# Patient Record
Sex: Male | Born: 1970 | Race: White | Hispanic: No | Marital: Married | Smoking: Current every day smoker
Health system: Southern US, Community
[De-identification: ages and names within clinical notes are randomized; demographics above are authoritative.]

## PROBLEM LIST (undated history)

## (undated) DIAGNOSIS — E785 Hyperlipidemia, unspecified: Secondary | ICD-10-CM

## (undated) DIAGNOSIS — J3089 Other allergic rhinitis: Secondary | ICD-10-CM

## (undated) DIAGNOSIS — F32A Depression, unspecified: Secondary | ICD-10-CM

## (undated) DIAGNOSIS — F329 Major depressive disorder, single episode, unspecified: Secondary | ICD-10-CM

## (undated) HISTORY — DX: Major depressive disorder, single episode, unspecified: F32.9

## (undated) HISTORY — DX: Depression, unspecified: F32.A

## (undated) HISTORY — PX: LEG SURGERY: SHX1003

---

## 1999-02-04 ENCOUNTER — Encounter: Admission: RE | Admit: 1999-02-04 | Discharge: 1999-02-11 | Payer: Self-pay

## 2004-05-09 ENCOUNTER — Emergency Department (HOSPITAL_COMMUNITY): Admission: EM | Admit: 2004-05-09 | Discharge: 2004-05-09 | Payer: Self-pay | Admitting: Family Medicine

## 2005-10-10 ENCOUNTER — Emergency Department (HOSPITAL_COMMUNITY): Admission: EM | Admit: 2005-10-10 | Discharge: 2005-10-11 | Payer: Self-pay | Admitting: Emergency Medicine

## 2011-11-04 ENCOUNTER — Ambulatory Visit (INDEPENDENT_AMBULATORY_CARE_PROVIDER_SITE_OTHER): Payer: No Typology Code available for payment source | Admitting: Family Medicine

## 2011-11-04 VITALS — BP 135/75 | HR 84 | Temp 98.5°F | Resp 17 | Ht 73.0 in | Wt 217.0 lb

## 2011-11-04 DIAGNOSIS — A084 Viral intestinal infection, unspecified: Secondary | ICD-10-CM

## 2011-11-04 DIAGNOSIS — A088 Other specified intestinal infections: Secondary | ICD-10-CM

## 2011-11-04 DIAGNOSIS — K649 Unspecified hemorrhoids: Secondary | ICD-10-CM

## 2011-11-04 NOTE — Progress Notes (Signed)
  [facility]:  Office Visit  Chief Complaint:  Chief Complaint  Patient presents with  . Diarrhea    3 days   . Hemorrhoids    7-10 years     HPI: Edward Doyle is a 41 y.o. male who complains of 4 day history of non bloody diarrhea after eating old lettuce. No fevers, chills. Tried Immodium got better but ate fettuchini, burger and milk and cereal and has gotten worse, 1 episode today. When started had 7 episodes. Denies recent camping, travels, new meds ie abx use.   No past medical history on file. No past surgical history on file. History   Social History  . Marital Status: Single    Spouse Name: N/A    Number of Children: N/A  . Years of Education: N/A   Social History Main Topics  . Smoking status: Current Everyday Smoker -- 0.5 packs/day for 20 years    Types: Cigarettes  . Smokeless tobacco: None  . Alcohol Use: None  . Drug Use: None  . Sexually Active: None   Other Topics Concern  . None   Social History Narrative  . None   No family history on file. No Known Allergies Prior to Admission medications   Not on File     ROS: The patient denies fevers, chills, night sweats, unintentional weight loss, chest pain, palpitations, wheezing, dyspnea on exertion, nausea, vomiting, abdominal pain, dysuria, hematuria, melena, numbness, weakness, or tingling. + diarrhea  All other systems have been reviewed and were otherwise negative with the exception of those mentioned in the HPI and as above.    PHYSICAL EXAM: Filed Vitals:   11/04/11 1512  BP: 135/75  Pulse: 84  Temp: 98.5 F (36.9 C)  Resp: 17   Filed Vitals:   11/04/11 1512  Height: 6\' 1"  (1.854 m)  Weight: 217 lb (98.431 kg)   Body mass index is 28.63 kg/(m^2).  General: Alert, no acute distress HEENT:  Normocephalic, atraumatic, oropharynx patent.  Cardiovascular:  Regular rate and rhythm, no rubs murmurs or gallops.  No Carotid bruits, radial pulse intact. No pedal edema.    Respiratory: Clear to auscultation bilaterally.  No wheezes, rales, or rhonchi.  No cyanosis, no use of accessory musculature GI: No organomegaly, abdomen is soft and non-tender, positive bowel sounds.  No masses. Skin: No rashes. Neurologic: Facial musculature symmetric. Psychiatric: Patient is appropriate throughout our interaction. Lymphatic: No cervical lymphadenopathy Musculoskeletal: Gait intact.   LABS: No results found for this or any previous visit.   EKG/XRAY:   Primary read interpreted by [facility] Conley [facility] at [facility].   ASSESSMENT/PLAN: Encounter Diagnoses  Name Primary?  . Viral gastroenteritis Yes  . Hemorrhoids    BRAT diet. Advance as tolerated. Increase fluid. If no improvement then will consider abx. Patient did not want hemorrhoids to be evaluated, d/w patient hemorrhoid care/management.    Hamilton Capri PHUONG, DO 11/04/2011 3:39 PM

## 2012-04-30 ENCOUNTER — Ambulatory Visit (INDEPENDENT_AMBULATORY_CARE_PROVIDER_SITE_OTHER): Payer: No Typology Code available for payment source | Admitting: Radiology

## 2012-04-30 DIAGNOSIS — Z23 Encounter for immunization: Secondary | ICD-10-CM

## 2012-10-16 ENCOUNTER — Ambulatory Visit (INDEPENDENT_AMBULATORY_CARE_PROVIDER_SITE_OTHER): Payer: No Typology Code available for payment source | Admitting: Family Medicine

## 2012-10-16 VITALS — BP 127/81 | HR 81 | Temp 98.7°F | Resp 16 | Ht 73.25 in | Wt 219.0 lb

## 2012-10-16 DIAGNOSIS — F419 Anxiety disorder, unspecified: Secondary | ICD-10-CM

## 2012-10-16 DIAGNOSIS — F17200 Nicotine dependence, unspecified, uncomplicated: Secondary | ICD-10-CM

## 2012-10-16 DIAGNOSIS — F411 Generalized anxiety disorder: Secondary | ICD-10-CM

## 2012-10-16 DIAGNOSIS — F17210 Nicotine dependence, cigarettes, uncomplicated: Secondary | ICD-10-CM

## 2012-10-16 MED ORDER — SERTRALINE HCL 50 MG PO TABS: 50.0000 mg | ORAL_TABLET | Freq: Every day | ORAL | Status: DC

## 2012-10-16 MED ORDER — VARENICLINE TARTRATE 0.5 MG PO TABS: 0.5000 mg | ORAL_TABLET | Freq: Two times a day (BID) | ORAL | Status: DC

## 2012-10-16 NOTE — Progress Notes (Signed)
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 He has 2 children, a boy 35 and a girl 61, and is having marital discord. The problems began many years ago but over the last 2 months he's been aware of his wife's relationships outside of marriage and her use of and abuse of prescription drugs. She will go to house to her apartment 5 days ago. Patient's been under treatment distress is having trouble sleeping more than 45 minutes.  Patient had problems he was in the service and believes he took Lexapro. That helped a great deal. In the past she's also tried Wellbutrin and Zyban for smoking cessation but they caused severe nightmares.  Patient also wanted to quit smoking. He states his 67th birthday is the day reckoning.  Objective:  LOC the patient for approximately 30 minutes as he related the story and difficulties he's having.  Assessment: Depression/anxiety, desire to quit smoking  Plan: Cigarette smoker - Plan: varenicline (CHANTIX) 0.5 MG tablet, DISCONTINUED: varenicline (CHANTIX) 0.5 MG tablet  Anxiety - Plan: sertraline (ZOLOFT) 50 MG tablet, DISCONTINUED: sertraline (ZOLOFT) 50 MG tablet  Signed, Elvina Sidle, MD

## 2012-10-29 ENCOUNTER — Telehealth: Payer: Self-pay

## 2012-10-29 NOTE — Telephone Encounter (Signed)
Pt is calling to let dr Milus Glazier know that the zoloft is not working   [employer] number [phone]

## 2012-10-30 MED ORDER — SERTRALINE HCL 100 MG PO TABS: 100.0000 mg | ORAL_TABLET | Freq: Every day | ORAL | Status: DC

## 2012-10-30 NOTE — Telephone Encounter (Signed)
I would suggest increasing the zoloft to 100 mg daily (2).  If not improving in a week, we should recheck.

## 2012-10-30 NOTE — Telephone Encounter (Signed)
Patient advised. 100 mg sent in, so when he refills it will be correct.

## 2012-10-30 NOTE — Addendum Note (Signed)
Addended byCaffie Damme on: 10/30/2012 09:29 AM   Modules accepted: Orders

## 2012-11-18 ENCOUNTER — Ambulatory Visit (INDEPENDENT_AMBULATORY_CARE_PROVIDER_SITE_OTHER): Payer: No Typology Code available for payment source | Admitting: Family Medicine

## 2012-11-18 VITALS — BP 100/78 | HR 85 | Temp 98.0°F | Resp 16 | Ht 73.0 in | Wt 219.0 lb

## 2012-11-18 DIAGNOSIS — J209 Acute bronchitis, unspecified: Secondary | ICD-10-CM

## 2012-11-18 DIAGNOSIS — J329 Chronic sinusitis, unspecified: Secondary | ICD-10-CM

## 2012-11-18 MED ORDER — HYDROCODONE-HOMATROPINE 5-1.5 MG/5ML PO SYRP: 5.0000 mL | ORAL_SOLUTION | Freq: Three times a day (TID) | ORAL | Status: DC | PRN

## 2012-11-18 MED ORDER — LEVOFLOXACIN 500 MG PO TABS: 500.0000 mg | ORAL_TABLET | Freq: Every day | ORAL | Status: DC

## 2012-11-18 NOTE — Progress Notes (Signed)
Edward Doyle MRN: [id number], DOB: 1971-02-24, 42 y.o. Date of Encounter: 11/18/2012, 8:46 AM  Primary Physician: No primary provider on file.  Chief Complaint:  Chief Complaint  Patient presents with  . Sinusitis    x 2 days    HPI: 42 y.o. year old male presents with a 2 day history of nasal congestion, post nasal drip, sore throat, and cough. Mild sinus pressure. Afebrile. No chills. Nasal congestion thick and green/yellow. Cough is productive of green/yellow sputum and not associated with time of day. Ears feel full, leading to sensation of muffled hearing. Has tried OTC cold preps without success. No GI complaints.   No sick contacts, recent antibiotics, or recent travels.   No leg trauma, sedentary periods, h/o cancer, or tobacco use.  History reviewed. No pertinent past medical history.   Home Meds: Prior to Admission medications   Medication Sig Start Date End Date Taking? Authorizing Provider  sertraline (ZOLOFT) 100 MG tablet Take 1 tablet (100 mg total) by mouth daily. 10/30/12  Yes Elvina Sidle, MD  varenicline (CHANTIX) 0.5 MG tablet Take 1 tablet (0.5 mg total) by mouth 2 (two) times daily. 10/16/12  Yes Elvina Sidle, MD    Allergies: No Known Allergies  History   Social History  . Marital Status: Single    Spouse Name: N/A    Number of Children: N/A  . Years of Education: N/A   Occupational History  . Not on file.   Social History Main Topics  . Smoking status: Current Every Day Smoker -- 0.50 packs/day for 20 years    Types: Cigarettes  . Smokeless tobacco: Not on file  . Alcohol Use: Not on file  . Drug Use: Not on file  . Sexually Active: Not on file   Other Topics Concern  . Not on file   Social History Narrative  . No narrative on file     Review of Systems: Constitutional: negative for chills, fever, night sweats or weight changes Cardiovascular: negative for chest pain or palpitations Respiratory: negative for hemoptysis, wheezing, or  shortness of breath Abdominal: negative for abdominal pain, nausea, vomiting or diarrhea Dermatological: negative for rash Neurologic: negative for headache   Physical Exam: Blood pressure 100/78, pulse 85, temperature 98 F (36.7 C), temperature source Oral, resp. rate 16, height 6\' 1"  (1.854 m), weight 219 lb (99.338 kg), SpO2 95.00%., Body mass index is 28.9 kg/(m^2). General: Well developed, well nourished, in no acute distress. Head: Normocephalic, atraumatic, eyes without discharge, sclera non-icteric, nares are congested. Bilateral auditory canals clear, TM's are without perforation, pearly grey with reflective cone of light bilaterally. No sinus TTP. Oral cavity moist, dentition normal. Posterior pharynx with post nasal drip and mild erythema. No peritonsillar abscess or tonsillar exudate. Neck: Supple. No thyromegaly. Full ROM. No lymphadenopathy. Lungs: Coarse breath sounds bilaterally without wheezes, rales, or rhonchi. Breathing is unlabored.  Heart: RRR with S1 S2. No murmurs, rubs, or gallops appreciated. Msk:  Strength and tone normal for age. Extremities: No clubbing or cyanosis. No edema. Neuro: Alert and oriented X 3. Moves all extremities spontaneously. CNII-XII grossly in tact. Psych:  Responds to questions appropriately with a normal affect.    ASSESSMENT AND PLAN:  42 y.o. year old male with bronchitis. - -Tylenol/Motrin prn -Rest/fluids -RTC precautions -RTC 3-5 days if no improvement  Signed, Elvina Sidle, MD 11/18/2012 8:46 AM

## 2012-11-23 ENCOUNTER — Telehealth: Payer: Self-pay

## 2012-11-23 NOTE — Telephone Encounter (Signed)
Pt is on his last antibodic this morning and still feels the same as when he started taking this medication   Would like to know what do next   [facility] number [phone]

## 2012-11-23 NOTE — Telephone Encounter (Signed)
Took levaquin Dr Milus Glazier had indicated RTC 3-5 days if no improvement

## 2012-11-23 NOTE — Telephone Encounter (Signed)
Called to advise patient to return to clinic. Left message.

## 2012-12-18 ENCOUNTER — Ambulatory Visit (INDEPENDENT_AMBULATORY_CARE_PROVIDER_SITE_OTHER): Payer: No Typology Code available for payment source | Admitting: Internal Medicine

## 2012-12-18 VITALS — BP 102/79 | HR 72 | Temp 98.0°F | Resp 16 | Ht 73.0 in | Wt 216.0 lb

## 2012-12-18 DIAGNOSIS — F39 Unspecified mood [affective] disorder: Secondary | ICD-10-CM

## 2012-12-18 DIAGNOSIS — L299 Pruritus, unspecified: Secondary | ICD-10-CM

## 2012-12-18 DIAGNOSIS — B86 Scabies: Secondary | ICD-10-CM

## 2012-12-18 MED ORDER — PERMETHRIN 5 % EX CREA: TOPICAL_CREAM | Freq: Once | CUTANEOUS | Status: DC

## 2012-12-18 MED ORDER — SERTRALINE HCL 100 MG PO TABS: 100.0000 mg | ORAL_TABLET | Freq: Every day | ORAL | Status: DC

## 2012-12-18 MED ORDER — CLOBETASOL PROP EMOLLIENT BASE 0.05 % EX CREA: TOPICAL_CREAM | CUTANEOUS | Status: DC

## 2012-12-18 MED ORDER — IVERMECTIN 3 MG PO TABS: 3.0000 mg | ORAL_TABLET | Freq: Once | ORAL | Status: DC

## 2012-12-18 MED ORDER — METHYLPREDNISOLONE ACETATE 80 MG/ML IJ SUSP
120.0000 mg | Freq: Once | INTRAMUSCULAR | Status: AC
  Administered 2012-12-18: 120 mg via INTRAMUSCULAR

## 2012-12-18 NOTE — Patient Instructions (Signed)
Scabies  Scabies are small bugs (mites) that burrow under the skin and cause red bumps and severe itching. These bugs can only be seen with a microscope. Scabies are highly contagious. They can spread easily from person to person by direct contact. They are also spread through sharing clothing or linens that have the scabies mites living in them. It is not unusual for an entire family to become infected through shared towels, clothing, or bedding.   HOME CARE INSTRUCTIONS   · Your caregiver may prescribe a cream or lotion to kill the mites. If cream is prescribed, massage the cream into the entire body from the neck to the bottom of both feet. Also massage the cream into the scalp and face if your child is less than 1 year old. Avoid the eyes and mouth. Do not wash your hands after application.  · Leave the cream on for 8 to 12 hours. Your child should bathe or shower after the 8 to 12 hour application period. Sometimes it is helpful to apply the cream to your child right before bedtime.  · One treatment is usually effective and will eliminate approximately 95% of infestations. For severe cases, your caregiver may decide to repeat the treatment in 1 week. Everyone in your household should be treated with one application of the cream.  · New rashes or burrows should not appear within 24 to 48 hours after successful treatment. However, the itching and rash may last for 2 to 4 weeks after successful treatment. Your caregiver may prescribe a medicine to help with the itching or to help the rash go away more quickly.  · Scabies can live on clothing or linens for up to 3 days. All of your child's recently used clothing, towels, stuffed toys, and bed linens should be washed in hot water and then dried in a dryer for at least 20 minutes on high heat. Items that cannot be washed should be enclosed in a plastic bag for at least 3 days.  · To help relieve itching, bathe your child in a cool bath or apply cool washcloths to the  affected areas.  · Your child may return to school after treatment with the prescribed cream.  SEEK MEDICAL CARE IF:   · The itching persists longer than 4 weeks after treatment.  · The rash spreads or becomes infected. Signs of infection include red blisters or yellow-tan crust.  Document Released: 04/18/2005 Document Revised: 07/11/2011 Document Reviewed: 08/27/2008  [employer]® Patient Information ©2014 [employer].

## 2012-12-18 NOTE — Progress Notes (Signed)
  Subjective:    Patient ID: Edward Doyle, male    DOB: 01-18-1971, 42 y.o.   MRN: [id number]  HPI Few weeks of itching and rash, no large plaques only small bumps between fingers and at creases/belt line. Has classic rash on glans. Started with his wife who works in nursing home. No systemic symptoms.   Review of Systems Mood issues    Objective:   Physical Exam  Vitals reviewed. Constitutional: He is oriented to person, place, and time. He appears well-developed and well-nourished.  HENT:  Head: Normocephalic.  Eyes: EOM are normal. No scleral icterus.  Neck: Normal range of motion.  Cardiovascular: Normal rate.   Pulmonary/Chest: Effort normal.  Musculoskeletal: Normal range of motion.  Neurological: He is alert and oriented to person, place, and time. No cranial nerve deficit. He exhibits normal muscle tone. Coordination normal.  Skin: Skin is intact. Rash noted.     Classic scabies rash  Psychiatric: He has a normal mood and affect. His behavior is normal. Judgment and thought content normal.          Assessment & Plan:  Scabies most likely

## 2012-12-19 ENCOUNTER — Telehealth: Payer: Self-pay

## 2012-12-19 NOTE — Telephone Encounter (Signed)
That is up to the treating provider only. We need to know of any medication allergies and all medications the family member are on also if he decides to treat. Will forward to the treating provider.

## 2012-12-19 NOTE — Telephone Encounter (Signed)
Can Rx be written out to call in for his family? Please advise

## 2012-12-19 NOTE — Telephone Encounter (Signed)
Patient was seen yesterday by dr. Perrin Maltese.   His diagnosis is scabbies.  He is requesting the pills and cream for his family members;   Mardella Layman Babino  12/02/1996  5'8"  190 lbs Tiernan Suto  [birth year]  6'0"  200 lbs  Clydia Llano  [birth year]  200 lbs  [facility] [place]  [phone]

## 2012-12-21 NOTE — Telephone Encounter (Signed)
Each needs to come in

## 2012-12-24 NOTE — Telephone Encounter (Signed)
LMOM to bring them in.

## 2013-08-13 ENCOUNTER — Ambulatory Visit (INDEPENDENT_AMBULATORY_CARE_PROVIDER_SITE_OTHER): Payer: No Typology Code available for payment source

## 2013-08-14 ENCOUNTER — Emergency Department (INDEPENDENT_AMBULATORY_CARE_PROVIDER_SITE_OTHER)
Admission: EM | Admit: 2013-08-14 | Discharge: 2013-08-14 | Disposition: A | Discharge status: Home / Self Care | Payer: PRIVATE HEALTH INSURANCE | Source: Home / Self Care | Attending: Family Medicine | Admitting: Family Medicine

## 2013-08-14 ENCOUNTER — Encounter: Payer: Self-pay | Admitting: Emergency Medicine

## 2013-08-14 DIAGNOSIS — J069 Acute upper respiratory infection, unspecified: Secondary | ICD-10-CM

## 2013-08-14 MED ORDER — AZITHROMYCIN 250 MG PO TABS: ORAL_TABLET | ORAL | Status: DC

## 2013-08-14 MED ORDER — BENZONATATE 200 MG PO CAPS: 200.0000 mg | ORAL_CAPSULE | Freq: Every day | ORAL | Status: DC

## 2013-08-14 NOTE — Discharge Instructions (Signed)
Take plain Mucinex (1200 mg guaifenesin) twice daily for cough and congestion.  May add Sudafed for sinus congestion.   Increase fluid intake, rest. May use Afrin nasal spray (or generic oxymetazoline) twice daily for about 5 days.  Also recommend using saline nasal spray several times daily and saline nasal irrigation (AYR is a common brand) Try warm salt water gargles for sore throat.  Stop all antihistamines for now, and other non-prescription cough/cold preparations. May take Ibuprofen 800mg  every 8 hours for headache, fever, etc. Begin Azithromycin if not improving about 5 days or if persistent fever develops   Follow-up with family doctor if not improving about10 days.

## 2013-08-14 NOTE — ED Provider Notes (Signed)
CSN: [id number]     Arrival date & time 08/14/13  1352 History   First MD Initiated Contact with Patient 08/14/13 1448     Chief Complaint  Patient presents with  . Sinus Problem      HPI Comments: Patient developed URI symptoms four days ago with mild sore throat, headache, sinus congestion, sneezing, myalgias, and chills.  He developed a cough yesterday.  Patient smokes.  The history is provided by the patient and the spouse.    Past Medical History  Diagnosis Date  . Depression    History reviewed. No pertinent past surgical history. No family history on file. History  Substance Use Topics  . Smoking status: Current Every Day Smoker -- 1.00 packs/day for 25 years    Types: Cigarettes  . Smokeless tobacco: Not on file  . Alcohol Use: Yes    Review of Systems + sore throat + cough No pleuritic pain ? wheezing + nasal congestion + post-nasal drainage No sinus pain/pressure No itchy/red eyes No earache No hemoptysis No SOB No fever, + chills No nausea No vomiting No abdominal pain No diarrhea No urinary symptoms No skin rash + fatigue + myalgias + headache Used OTC meds without relief  Allergies  Review of patient's allergies indicates not on file.  Home Medications   Prior to Admission medications   Medication Sig Start Date End Date Taking? Authorizing Provider  cetirizine (ZYRTEC) 10 MG tablet Take 10 mg by mouth daily.   Yes Historical Provider, MD  guaiFENesin (MUCINEX) 600 MG 12 hr tablet Take by mouth 2 (two) times daily.   Yes Historical Provider, MD  phenylephrine (NEO-SYNEPHRINE) 1 % nasal spray Place 1 drop into both nostrils every 6 (six) hours as needed for congestion.   Yes Historical Provider, MD   BP 122/82  Pulse 91  Temp(Src) 98.8 F (37.1 C) (Oral)  Ht 6' (1.829 m)  Wt 239 lb (108.41 kg)  BMI 32.41 kg/m2  SpO2 95% Physical Exam Nursing notes and Vital Signs reviewed. Appearance:  Patient appears stated age, and in no acute  distress.  Patient is obese (BMI 32.4) Eyes:  Pupils are equal, round, and reactive to light and accomodation.  Extraocular movement is intact.  Conjunctivae are not inflamed  Ears:  Canals normal.  Tympanic membranes normal.  Nose:  Mildly congested turbinates.  No sinus tenderness.  Pharynx:  Normal Neck:  Supple.   Tender enlarged posterior nodes are palpated bilaterally  Lungs:  Clear to auscultation.  Breath sounds are equal.  Heart:  Regular rate and rhythm without murmurs, rubs, or gallops.  Abdomen:  Nontender without masses or hepatosplenomegaly.  Bowel sounds are present.  No CVA or flank tenderness.  Extremities:  No edema.  No calf tenderness Skin:  No rash present.   ED Course  Procedures  none      MDM   1. Acute upper respiratory infections of unspecified site    There is no evidence of bacterial infection today.  Treat symptomatically for now  Prescription written for Benzonatate (Tessalon) to take at bedtime for night-time cough.  Take plain Mucinex (1200 mg guaifenesin) twice daily for cough and congestion.  May add Sudafed for sinus congestion.   Increase fluid intake, rest. May use Afrin nasal spray (or generic oxymetazoline) twice daily for about 5 days.  Also recommend using saline nasal spray several times daily and saline nasal irrigation (AYR is a common brand) Try warm salt water gargles for sore throat.  Stop  all antihistamines for now, and other non-prescription cough/cold preparations. May take Ibuprofen 800mg  every 8 hours for headache, fever, etc. Begin Azithromycin if not improving about 5 days or if persistent fever develops (Given a prescription to hold, with an expiration date)  Follow-up with family doctor if not improving about10 days.     Lattie HawStephen A Beese, MD 08/14/13 2012

## 2013-08-14 NOTE — ED Notes (Signed)
Sneezing, congestion, runny nose, headache, body aches x 5 days

## 2013-10-02 ENCOUNTER — Ambulatory Visit (INDEPENDENT_AMBULATORY_CARE_PROVIDER_SITE_OTHER): Payer: No Typology Code available for payment source | Admitting: Family Medicine

## 2013-10-02 VITALS — BP 124/64 | HR 76 | Temp 98.0°F | Resp 16 | Ht 73.0 in | Wt 241.0 lb

## 2013-10-02 DIAGNOSIS — M7700 Medial epicondylitis, unspecified elbow: Secondary | ICD-10-CM

## 2013-10-02 DIAGNOSIS — J019 Acute sinusitis, unspecified: Secondary | ICD-10-CM

## 2013-10-02 DIAGNOSIS — M7710 Lateral epicondylitis, unspecified elbow: Secondary | ICD-10-CM

## 2013-10-02 MED ORDER — NAPROXEN 500 MG PO TABS: 500.0000 mg | ORAL_TABLET | Freq: Two times a day (BID) | ORAL | Status: DC

## 2013-10-02 NOTE — Patient Instructions (Signed)
Thank you for coming in today  For your sinusitis, continue mucinex and sudafed. Take tylenol or ibuprofen for sinus headache. Return if not better by 10 days or onset of fever or worsening illness  For your elbow, you have golfers elbow.  - Obtain tennis elbow strap and wear during golf or yard work - Photographerce after golf for 15 minutes - Start doing wrist flexion and pronation exercises - Return if you get recurrence of the "funny bone" symptoms   Medial Epicondylitis (Golfer's Elbow) with Rehab Medial epicondylitis involves inflammation and pain around the inner (medial) portion of the elbow. This pain is caused by inflammation of the tendons in the forearm that flex (bring down) the wrist. Medial epicondylitis is also called golfer's elbow, because it is common among golfers. However, it may occur in any individual who flexes the wrist regularly. If medial epicondylitis is left untreated, it may become a chronic problem. SYMPTOMS   Pain, tenderness, or inflammation over the inner (medial) side of the elbow.  Pain or weakness with gripping activities.  Pain that increases with wrist twisting motions (using a screwdriver, playing golf, bowling). CAUSES  Medial epicondylitis is caused by inflammation of the tendons that flex the wrist. Causes of injury may include:  Chronic, repetitive stress and strain to the tendons that run from the wrist and forearm to the elbow.  Sudden strain on the forearm, including wrist snap when serving balls with racquet sports, or throwing a baseball. RISK INCREASES WITH:  Sports or occupations that require repetitive and/or strenuous forearm and wrist movements (pitching a baseball, golfing, carpentry).  Poor wrist and forearm strength and flexibility.  Failure to warm up properly before activity.  Resuming activity before healing, rehabilitation, and conditioning are complete. PREVENTION   Warm up and stretch properly before activity.  Maintain  physical fitness:  Strength, flexibility, and endurance.  Cardiovascular fitness.  Wear and use properly fitted equipment.  Learn and use proper technique and have a coach correct improper technique.  Wear a tennis elbow (counterforce) brace. PROGNOSIS  The course of this condition depends on the degree of the injury. If treated properly, acute cases (symptoms lasting less than 4 weeks) are often resolved in 2 to 6 weeks. Chronic (longer lasting cases) often resolve in 3 to 6 months, but may require physical therapy. RELATED COMPLICATIONS   Frequently recurring symptoms, resulting in a chronic problem. Properly treating the problem the first time decreases frequency of recurrence.  Chronic inflammation, scarring, and partial tendon tear, requiring surgery.  Delayed healing or resolution of symptoms. TREATMENT  Treatment first involves the use of ice and medicine, to reduce pain and inflammation. Strengthening and stretching exercises may reduce discomfort, if performed regularly. These exercises may be performed at home, if the condition is an acute injury. Chronic cases may require a referral to a physical therapist for evaluation and treatment. Your caregiver may advise a corticosteroid injection to help reduce inflammation. Rarely, surgery is needed. MEDICATION  If pain medicine is needed, nonsteroidal anti-inflammatory medicines (aspirin and ibuprofen), or other minor pain relievers (acetaminophen), are often advised.  Do not take pain medicine for 7 days before surgery.  Prescription pain relievers may be given, if your caregiver thinks they are needed. Use only as directed and only as much as you need.  Corticosteroid injections may be recommended. These injections should be reserved only for the most severe cases, because they can only be given a certain number of times. HEAT AND COLD  Cold treatment (  icing) should be applied for 10 to 15 minutes every 2 to 3 hours for  inflammation and pain, and immediately after activity that aggravates your symptoms. Use ice packs or an ice massage.  Heat treatment may be used before performing stretching and strengthening activities prescribed by your caregiver, physical therapist, or athletic trainer. Use a heat pack or a warm water soak. SEEK MEDICAL CARE IF: Symptoms get worse or do not improve in 2 weeks, despite treatment. EXERCISES  RANGE OF MOTION (ROM) AND STRETCHING EXERCISES - Epicondylitis, Medial (Golfer's Elbow) These exercises may help you when beginning to rehabilitate your injury. Your symptoms may go away with or without further involvement from your physician, physical therapist or athletic trainer. While completing these exercises, remember:   Restoring tissue flexibility helps normal motion to return to the joints. This allows healthier, less painful movement and activity.  An effective stretch should be held for at least 30 seconds.  A stretch should never be painful. You should only feel a gentle lengthening or release in the stretched tissue. RANGE OF MOTION  Wrist Flexion, Active-Assisted  Extend your right / left elbow with your fingers pointing down.*  Gently pull the back of your hand towards you, until you feel a gentle stretch on the top of your forearm.  Hold this position for 5 seconds. Repeat 10 times. Complete this exercise 1 times per day.  *If directed by your physician, physical therapist or athletic trainer, complete this stretch with your elbow bent, rather than extended. RANGE OF MOTION  Wrist Extension, Active-Assisted  Extend your right / left elbow and turn your palm upwards.*  Gently pull your palm and fingertips back, so your wrist extends and your fingers point more toward the ground.  You should feel a gentle stretch on the inside of your forearm.  Hold this position for __________ seconds. Repeat __________ times. Complete this exercise __________ times per day. *If  directed by your physician, physical therapist or athletic trainer, complete this stretch with your elbow bent, rather than extended. STRETCH  Wrist Extension   Place your right / left fingertips on a tabletop leaving your elbow slightly bent. Your fingers should point backwards.  Gently press your fingers and palm down onto the table, by straightening your elbow. You should feel a stretch on the inside of your forearm.  Hold this position for __________ seconds. Repeat __________ times. Complete this stretch __________ times per day.  STRENGTHENING EXERCISES - Epicondylitis, Medial (Golfer's Elbow) These exercises may help you when beginning to rehabilitate your injury. They may resolve your symptoms with or without further involvement from your physician, physical therapist or athletic trainer. While completing these exercises, remember:   Muscles can gain both the endurance and the strength needed for everyday activities through controlled exercises.  Complete these exercises as instructed by your physician, physical therapist or athletic trainer. Increase the resistance and repetitions only as guided.  You may experience muscle soreness or fatigue, but the pain or discomfort you are trying to eliminate should never worsen during these exercises. If this pain does get worse, stop and make sure you are following the directions exactly. If the pain is still present after adjustments, discontinue the exercise until you can discuss the trouble with your caregiver. STRENGTH Wrist Flexors  Sit with your right / left forearm palm-up, and fully supported on a table or countertop. Your elbow should be resting below the height of your shoulder. Allow your wrist to extend over the edge of  the surface.  Loosely holding a __________ weight, or a piece of rubber exercise band or tubing, slowly curl your hand up toward your forearm.  Hold this position for __________ seconds. Slowly lower the wrist back  to the starting position in a controlled manner. Repeat __________ times. Complete this exercise __________ times per day.  STRENGTH  Wrist Extensors  Sit with your right / left forearm palm-down and fully supported. Your elbow should be resting below the height of your shoulder. Allow your wrist to extend over the edge of the surface.  Loosely holding a __________ weight, or a piece of rubber exercise band or tubing, slowly curl your hand up toward your forearm.  Hold this position for __________ seconds. Slowly lower the wrist back to the starting position in a controlled manner. Repeat __________ times. Complete this exercise __________ times per day.  STRENGTH - Ulnar Deviators  Stand with a ____________________ weight in your right / left hand, or sit while holding a rubber exercise band or tubing, with your healthy arm supported on a table or countertop.  Move your wrist so that your pinkie travels toward your forearm and your thumb moves away from your forearm.  Hold this position for __________ seconds and then slowly lower the wrist back to the starting position. Repeat __________ times. Complete this exercise __________ times per day STRENGTH - Grip   Grasp a tennis ball, a dense sponge, or a large, rolled sock in your hand.  Squeeze as hard as you can, without increasing any pain.  Hold this position for __________ seconds. Release your grip slowly. Repeat __________ times. Complete this exercise __________ times per day.  STRENGTH  Forearm Supinators   Sit with your right / left forearm supported on a table, keeping your elbow below shoulder height. Rest your hand over the edge, palm down.  Gently grip a hammer or a soup ladle.  Without moving your elbow, slowly turn your palm and hand upward to a "thumbs-up" position.  Hold this position for __________ seconds. Slowly return to the starting position. Repeat __________ times. Complete this exercise __________ times per  day.  STRENGTH  Forearm Pronators  Sit with your right / left forearm supported on a table, keeping your elbow below shoulder height. Rest your hand over the edge, palm up.  Gently grip a hammer or a soup ladle.  Without moving your elbow, slowly turn your palm and hand upward to a "thumbs-up" position.  Hold this position for __________ seconds. Slowly return to the starting position. Repeat __________ times. Complete this exercise __________ times per day.  Document Released: 04/18/2005 Document Revised: 07/11/2011 Document Reviewed: 07/31/2008 [facility] Patient Information 2014 [place], [place].

## 2013-10-02 NOTE — Addendum Note (Signed)
Addended by: Daine Gip on: 10/02/2013 06:44 PM   Modules accepted: Level of Service

## 2013-10-02 NOTE — Progress Notes (Signed)
   Subjective:    Patient ID: Edward Doyle, male    DOB: 06-Mar-1971, 43 y.o.   MRN: [id number]  HPI Patient presents with illness. Started to get sick 2-3 days ago with sinus, cough, congestion. Cough, worse at night, green in color. Thought it was getting better and then today got worse again. Head swimming. No fever, vomiting, nausea, diarrhea. Having sinus headache. Has been taking mucinex, helps some. Throat is raw. Drinking okay.   Also concerned about right elbow. Played golf last Friday and is getting pain around medial elbow. One time after golfing it actually go bruised. Sometimes has trouble bending and straightening elbow. Felt a pop in elbow this weekend trying to kill a bug. Felt like hit in funny bone. Not tried any treatment other than heating pack.  Review of Systems  All other systems reviewed and are negative.     Objective:   Physical Exam  Constitutional: He is oriented to person, place, and time. He appears well-developed and well-nourished. No distress.  HENT:  Head: Normocephalic and atraumatic.  Eyes: Conjunctivae are normal. Pupils are equal, round, and reactive to light. No scleral icterus.  Neck: Normal range of motion. Neck supple.  Cardiovascular: Normal rate, regular rhythm and normal heart sounds.   Pulmonary/Chest: Effort normal and breath sounds normal. No respiratory distress. He has no wheezes. He has no rales.  Musculoskeletal: Normal range of motion.  Lymphadenopathy:    He has no cervical adenopathy.  Neurological: He is alert and oriented to person, place, and time.  Skin: Skin is warm and dry.  Psychiatric: He has a normal mood and affect. His behavior is normal. Thought content normal.  Right elbow: FROM flex, ext, pron, sup. Strength 5/5 wrist flex no pain. Strength 5/5 pronation with mild pain. TTP over medial epicondyle. Neg UCL testing. No sublux ulnar nerve. Neg cubital tunnel percussion.    Assessment & Plan:  #1. Acute sinusitis - Like  viral - Continue mucinex and sudafed - ibuprofen/tylenol prn - return precautions  #2. Medial epicondylitis - elbow brace - HEP - Ice - ? Ulnar nerve subluxation? Neg exam today - F/u prn

## 2013-11-03 ENCOUNTER — Emergency Department (INDEPENDENT_AMBULATORY_CARE_PROVIDER_SITE_OTHER)
Admission: EM | Admit: 2013-11-03 | Discharge: 2013-11-03 | Disposition: A | Discharge status: Home / Self Care | Payer: PRIVATE HEALTH INSURANCE | Source: Home / Self Care | Attending: Family Medicine | Admitting: Family Medicine

## 2013-11-03 ENCOUNTER — Encounter: Payer: Self-pay | Admitting: Emergency Medicine

## 2013-11-03 DIAGNOSIS — J029 Acute pharyngitis, unspecified: Secondary | ICD-10-CM

## 2013-11-03 DIAGNOSIS — J020 Streptococcal pharyngitis: Secondary | ICD-10-CM

## 2013-11-03 LAB — POCT RAPID STREP A (OFFICE): Rapid Strep A Screen: POSITIVE — AB

## 2013-11-03 MED ORDER — PENICILLIN V POTASSIUM 500 MG PO TABS: ORAL_TABLET | ORAL | Status: DC

## 2013-11-03 NOTE — Discharge Instructions (Signed)
May take Ibuprofen 200mg , 4 tabs every 8 hours with food for sore throat, fever, etc.  Try warm salt water gargles for sore throat.    Salt Water Gargle This solution will help make your mouth and throat feel better. HOME CARE INSTRUCTIONS   Mix 1 teaspoon of salt in 8 ounces of warm water.  Gargle with this solution as much or often as you need or as directed. Swish and gargle gently if you have any sores or wounds in your mouth.  Do not swallow this mixture. Document Released: 01/21/2004 Document Revised: 07/11/2011 Document Reviewed: 06/13/2008 [facility] Patient Information 2015 [place], [place]. This information is not intended to replace advice given to you by your health care provider. Make sure you discuss any questions you have with your health care provider.

## 2013-11-03 NOTE — ED Notes (Signed)
Edward Doyle complains of fevers, sore throat and body aches for 1 day.

## 2013-11-03 NOTE — ED Provider Notes (Signed)
CSN: [id number]     Arrival date & time 11/03/13  1100 History   First MD Initiated Contact with Patient 11/03/13 1118     Chief Complaint  Patient presents with  . Sore Throat  . Generalized Body Aches  . Fever      HPI Comments: Last night patient developed myalgias, sore throat, headache, and chills/sweats.  The history is provided by the patient.    Past Medical History  Diagnosis Date  . Depression    History reviewed. No pertinent past surgical history. History reviewed. No pertinent family history. History  Substance Use Topics  . Smoking status: Current Every Day Smoker -- 1.00 packs/day for 25 years    Types: Cigarettes  . Smokeless tobacco: Not on file  . Alcohol Use: Yes    Review of Systems + sore throat + hoarseness No cough No pleuritic pain No wheezing No nasal congestion ? post-nasal drainage No sinus pain/pressure No itchy/red eyes No earache No hemoptysis No SOB No fever, + chills/sweats No nausea No vomiting No abdominal pain No diarrhea No urinary symptoms No skin rash + fatigue No myalgias + headache Used OTC meds without relief  Allergies  Review of patient's allergies indicates no known allergies.  Home Medications   Prior to Admission medications   Medication Sig Start Date End Date Taking? Authorizing Provider  azithromycin (ZITHROMAX Z-PAK) 250 MG tablet Take 2 tabs today; then begin one tab once daily for 4 more days. 08/14/13   Lattie HawStephen A Orland Visconti, MD  benzonatate (TESSALON) 200 MG capsule Take 1 capsule (200 mg total) by mouth at bedtime. Take as needed for cough 08/14/13   Lattie HawStephen A Yoshiye Kraft, MD  cetirizine (ZYRTEC) 10 MG tablet Take 10 mg by mouth daily.    Historical Provider, MD  guaiFENesin (MUCINEX) 600 MG 12 hr tablet Take by mouth 2 (two) times daily.    Historical Provider, MD  naproxen (NAPROSYN) 500 MG tablet Take 1 tablet (500 mg total) by mouth 2 (two) times daily with a meal. 10/02/13   Daine GipErin L Voss, MD  penicillin v  potassium (VEETID) 500 MG tablet Take one tab by mouth twice daily for 10 days 11/03/13   Lattie HawStephen A Nemiah Kissner, MD  phenylephrine (NEO-SYNEPHRINE) 1 % nasal spray Place 1 drop into both nostrils every 6 (six) hours as needed for congestion.    Historical Provider, MD   BP 107/74  Pulse 110  Temp(Src) 102.2 F (39 C) (Oral)  Resp 16  Ht 6\' 1"  (1.854 m)  Wt 235 lb (106.595 kg)  BMI 31.01 kg/m2  SpO2 96% Physical Exam Nursing notes and Vital Signs reviewed. Appearance:  Patient appears stated age, and in no acute distress.  Patient is obese (BMI 31.0) Eyes:  Pupils are equal, round, and reactive to light and accomodation.  Extraocular movement is intact.  Conjunctivae are not inflamed  Ears:  Canals normal.  Tympanic membranes normal.  Nose:   Normal turbinates.  No sinus tenderness.    Pharynx:  Erythematous and slightly swollen without obstruction.  Neck:  Supple.   Tender enlarged anterior nodes are palpated bilaterally  Lungs:  Clear to auscultation.  Breath sounds are equal.  Heart:  Regular rate and rhythm without murmurs, rubs, or gallops.  Abdomen:  Nontender without masses or hepatosplenomegaly.  Bowel sounds are present.  No CVA or flank tenderness.  Extremities:  No edema.  No calf tenderness Skin:  No rash present.   ED Course  Procedures  none  Labs Reviewed  POCT RAPID STREP A (OFFICE) - Abnormal; Notable for the following:    Rapid Strep A Screen Positive (*)              MDM   1. Sore throat   2. Streptococcal sore throat    Begin Pen VK May take Ibuprofen 200mg , 4 tabs every 8 hours with food for sore throat, fever, etc.  Try warm salt water gargles for sore throat.  Followup with Family Doctor if not improved in one week.     Lattie HawStephen A Mahari Vankirk, MD 11/04/13 1310

## 2013-11-05 ENCOUNTER — Emergency Department (INDEPENDENT_AMBULATORY_CARE_PROVIDER_SITE_OTHER)
Admission: EM | Admit: 2013-11-05 | Discharge: 2013-11-05 | Disposition: A | Discharge status: Home / Self Care | Payer: PRIVATE HEALTH INSURANCE | Source: Home / Self Care | Attending: Family Medicine | Admitting: Family Medicine

## 2013-11-05 ENCOUNTER — Encounter: Payer: Self-pay | Admitting: Emergency Medicine

## 2013-11-05 ENCOUNTER — Telehealth: Payer: Self-pay

## 2013-11-05 DIAGNOSIS — J0300 Acute streptococcal tonsillitis, unspecified: Secondary | ICD-10-CM

## 2013-11-05 DIAGNOSIS — J020 Streptococcal pharyngitis: Secondary | ICD-10-CM

## 2013-11-05 MED ORDER — METHYLPREDNISOLONE ACETATE 80 MG/ML IJ SUSP
80.0000 mg | Freq: Once | INTRAMUSCULAR | Status: AC
  Administered 2013-11-05: 80 mg via INTRAMUSCULAR

## 2013-11-05 MED ORDER — HYDROCODONE-ACETAMINOPHEN 5-325 MG PO TABS: ORAL_TABLET | ORAL | Status: DC

## 2013-11-05 MED ORDER — LIDOCAINE VISCOUS 2 % MT SOLN: 15.0000 mL | Freq: Three times a day (TID) | OROMUCOSAL | Status: DC

## 2013-11-05 NOTE — ED Notes (Signed)
Edward Doyle called and complains he is not feeling better. He is on his 3rd day of antibiotic for strep. He no longer has a fever but he does have fatigue and sore throat. I advised him to return to the clinic because he needed to be evaluated.

## 2013-11-05 NOTE — ED Provider Notes (Signed)
CSN: [id number]     Arrival date & time 11/05/13  1720 History   First MD Initiated Contact with Patient 11/05/13 1742     Chief Complaint  Patient presents with  . Sore Throat      HPI Comments: Patient complains of persistent sore throat, fatigue, and headache.  He is able to swallow small sips of fluid.  Fever has resolved but he continues to have chills. He states that he has had several similar episodes of strep tonsillitis in the past that has always slowly responded to penicillin.  The history is provided by the patient.    Past Medical History  Diagnosis Date  . Depression    History reviewed. No pertinent past surgical history. History reviewed. No pertinent family history. History  Substance Use Topics  . Smoking status: Current Every Day Smoker -- 1.00 packs/day for 25 years    Types: Cigarettes  . Smokeless tobacco: Not on file  . Alcohol Use: Yes    Review of Systems + sore throat No cough No pleuritic pain No wheezing No nasal congestion No post-nasal drainage No sinus pain/pressure No itchy/red eyes No earache No hemoptysis ? SOB No fever, + chills + nausea No vomiting + abdominal pain No diarrhea No urinary symptoms No skin rash + fatigue + myalgias + headache Used OTC meds without relief  Allergies  Review of patient's allergies indicates no known allergies.  Home Medications   Prior to Admission medications   Medication Sig Start Date End Date Taking? Authorizing Provider  azithromycin (ZITHROMAX Z-PAK) 250 MG tablet Take 2 tabs today; then begin one tab once daily for 4 more days. 08/14/13   Lattie HawStephen A Beese, MD  benzonatate (TESSALON) 200 MG capsule Take 1 capsule (200 mg total) by mouth at bedtime. Take as needed for cough 08/14/13   Lattie HawStephen A Beese, MD  cetirizine (ZYRTEC) 10 MG tablet Take 10 mg by mouth daily.    Historical Provider, MD  guaiFENesin (MUCINEX) 600 MG 12 hr tablet Take by mouth 2 (two) times daily.    Historical Provider,  MD  HYDROcodone-acetaminophen (NORCO/VICODIN) 5-325 MG per tablet Take one by mouth at bedtime as needed for pain 11/05/13   Lattie HawStephen A Beese, MD  lidocaine (XYLOCAINE) 2 % solution Use as directed 15 mLs in the mouth or throat 4 (four) times daily -  before meals and at bedtime. Gargle, then expectorate 11/05/13   Lattie HawStephen A Beese, MD  naproxen (NAPROSYN) 500 MG tablet Take 1 tablet (500 mg total) by mouth 2 (two) times daily with a meal. 10/02/13   Daine GipErin L Voss, MD  penicillin v potassium (VEETID) 500 MG tablet Take one tab by mouth twice daily for 10 days 11/03/13   Lattie HawStephen A Beese, MD  phenylephrine (NEO-SYNEPHRINE) 1 % nasal spray Place 1 drop into both nostrils every 6 (six) hours as needed for congestion.    Historical Provider, MD   BP 117/84  Pulse 81  Temp(Src) 98.5 F (36.9 C) (Oral)  Resp 18  Wt 231 lb (104.781 kg)  SpO2 98% Physical Exam Nursing notes and Vital Signs reviewed. Appearance:  Patient appears stated age, and in no acute distress.  He is alert and oriented.  Eyes:  Pupils are equal, round, and reactive to light and accomodation.  Extraocular movement is intact.  Conjunctivae are not inflamed  Ears:  Canals normal.  Tympanic membranes normal.  Nose:  Normal turbinates.  No sinus tenderness.    Pharynx:  Tonsils erythematous and  swollen without obstruction.  Exudate present. Neck:  Supple.  Tender enlarged anterior nodes are palpated bilaterally  Lungs:  Clear to auscultation.  Breath sounds are equal.  Heart:  Regular rate and rhythm without murmurs, rubs, or gallops.  Abdomen:  Nontender without masses or hepatosplenomegaly.  Bowel sounds are present.  No CVA or flank tenderness.  Extremities:  No edema.  No calf tenderness Skin:  No rash present.    ED Course  Procedures  none     MDM   1. Acute streptococcal tonsillitis    DepoMedrol 80mg  IM.  Begin Lidocaine Viscous gargles QID. Lortab for pain at night. Finish penicillin. If symptoms become significantly worse  during the night or over the weekend, proceed to the local emergency room.     Lattie HawStephen A Beese, MD 11/08/13 2008

## 2013-11-05 NOTE — Discharge Instructions (Signed)
Finish penicillin

## 2013-11-05 NOTE — ED Notes (Signed)
Javonn reports that he was dx with strep throat on 11/03/13, he has taken 5 pills of Penicillins and has not improvement. He reports that his fever resolved yesterday.

## 2013-11-14 ENCOUNTER — Emergency Department (INDEPENDENT_AMBULATORY_CARE_PROVIDER_SITE_OTHER)
Admission: EM | Admit: 2013-11-14 | Discharge: 2013-11-14 | Disposition: A | Discharge status: Home / Self Care | Payer: PRIVATE HEALTH INSURANCE | Source: Home / Self Care | Attending: Family Medicine | Admitting: Family Medicine

## 2013-11-14 ENCOUNTER — Encounter: Payer: Self-pay | Admitting: Emergency Medicine

## 2013-11-14 ENCOUNTER — Other Ambulatory Visit: Payer: Self-pay | Admitting: Family Medicine

## 2013-11-14 DIAGNOSIS — J020 Streptococcal pharyngitis: Secondary | ICD-10-CM

## 2013-11-14 LAB — POCT URINALYSIS DIP (MANUAL ENTRY)
Bilirubin, UA: NEGATIVE
Glucose, UA: NEGATIVE
Ketones, POC UA: NEGATIVE
Leukocytes, UA: NEGATIVE
Nitrite, UA: NEGATIVE
SPEC GRAV UA: 1.025 (ref 1.005–1.03)
Urobilinogen, UA: 0.2 (ref 0–1)
pH, UA: 6 (ref 5–8)

## 2013-11-14 LAB — POCT RAPID STREP A (OFFICE): RAPID STREP A SCREEN: POSITIVE — AB

## 2013-11-14 NOTE — ED Provider Notes (Signed)
CSN: [id number]     Arrival date & time 11/14/13  1211 History   First MD Initiated Contact with Patient 11/14/13 1238     Chief Complaint  Patient presents with  . Sore Throat      HPI Comments: Patient finished penicillin for strep pharyngitis two days ago, and he felt well at that time.  Last night he developed recurrent sore throat, fever, myalgias, headache, and fatigue.  About a year ago his wife was exposed in her work place to a strain of strep that was resistant to penicillin.  No chest pain or hematuria.    Patient is a 43 y.o. male presenting with pharyngitis. The history is provided by the patient and the spouse.  Sore Throat This is a recurrent problem. The current episode started yesterday. The problem occurs constantly. The problem has been gradually worsening. Exacerbated by: swallowing. Nothing relieves the symptoms. He has tried nothing for the symptoms.    Past Medical History  Diagnosis Date  . Depression    History reviewed. No pertinent past surgical history. No family history on file. History  Substance Use Topics  . Smoking status: Current Every Day Smoker -- 1.00 packs/day for 25 years    Types: Cigarettes  . Smokeless tobacco: Not on file  . Alcohol Use: Yes    Review of Systems + sore throat No cough No pleuritic pain No wheezing No nasal congestion ? post-nasal drainage No sinus pain/pressure No itchy/red eyes ? earache No hemoptysis No SOB + fever, + chills No nausea No vomiting No abdominal pain No diarrhea No urinary symptoms No skin rash + fatigue + myalgias + headache Used OTC meds without relief  Allergies  Review of patient's allergies indicates not on file.  Home Medications   Prior to Admission medications   Medication Sig Start Date End Date Taking? Authorizing Provider  azithromycin (ZITHROMAX Z-PAK) 250 MG tablet Take 2 tabs today; then begin one tab once daily for 4 more days. 08/14/13   Lattie HawStephen A Verlan Grotz, MD   benzonatate (TESSALON) 200 MG capsule Take 1 capsule (200 mg total) by mouth at bedtime. Take as needed for cough 08/14/13   Lattie HawStephen A Nyonna Hargrove, MD  cetirizine (ZYRTEC) 10 MG tablet Take 10 mg by mouth daily.    Historical Provider, MD  guaiFENesin (MUCINEX) 600 MG 12 hr tablet Take by mouth 2 (two) times daily.    Historical Provider, MD  HYDROcodone-acetaminophen (NORCO/VICODIN) 5-325 MG per tablet Take one by mouth at bedtime as needed for pain 11/05/13   Lattie HawStephen A Joei Frangos, MD  lidocaine (XYLOCAINE) 2 % solution Use as directed 15 mLs in the mouth or throat 4 (four) times daily -  before meals and at bedtime. Gargle, then expectorate 11/05/13   Lattie HawStephen A Sandrea Boer, MD  naproxen (NAPROSYN) 500 MG tablet Take 1 tablet (500 mg total) by mouth 2 (two) times daily with a meal. 10/02/13   Daine GipErin L Voss, MD  penicillin v potassium (VEETID) 500 MG tablet Take one tab by mouth twice daily for 10 days 11/03/13   Lattie HawStephen A Nailani Full, MD  phenylephrine (NEO-SYNEPHRINE) 1 % nasal spray Place 1 drop into both nostrils every 6 (six) hours as needed for congestion.    Historical Provider, MD   BP 119/82  Pulse 95  Temp(Src) 99.7 F (37.6 C) (Oral)  Ht 6\' 1"  (1.854 m)  Wt 227 lb (102.967 kg)  BMI 29.96 kg/m2  SpO2 96% Physical Exam Nursing notes and Vital Signs reviewed. Appearance:  Patient appears healthy, stated age, and in no acute distress Eyes:  Pupils are equal, round, and reactive to light and accomodation.  Extraocular movement is intact.  Conjunctivae are not inflamed  Ears:  Canals normal.  Tympanic membranes normal.  Nose:   Normal turbinates.  No sinus tenderness.   Pharynx:  Erythematous and swollen without obstruction.  Exudate present. Neck:  Supple.   Tender shotty anterior nodes are palpated bilaterally  Lungs:  Clear to auscultation.  Breath sounds are equal.  Heart:  Regular rate and rhythm without murmurs, rubs, or gallops.  Abdomen:  Nontender without masses or hepatosplenomegaly.  Bowel sounds are  present.  No CVA or flank tenderness.  Extremities:  No edema.  No calf tenderness Skin:  No rash present.   ED Course  Procedures     Labs Reviewed  POCT RAPID STREP A (OFFICE) - Abnormal; Notable for the following:    Rapid Strep A Screen Positive (*)       POCT URINALYSIS DIP (MANUAL ENTRY):  BLO trace-intact; PRO trace         MDM   1. Streptococcal sore throat; recurrent    Throat culture pending (Check wife also) Note trace blood and trace protein on urinalysis.  Will check renal function with BMP. Rocephin 1gm IM.  Return in 18 hours for a second dose of 1gm Throat culture pending If strep pharyngitis recurs, recommend follow-up with ID.      Lattie Haw, MD 11/14/13 [phone]

## 2013-11-14 NOTE — ED Notes (Signed)
Sore throat, fever started last night just finished abx

## 2013-11-14 NOTE — Discharge Instructions (Signed)
May continue Tylenol for fever, sore throat.

## 2013-11-15 ENCOUNTER — Emergency Department (INDEPENDENT_AMBULATORY_CARE_PROVIDER_SITE_OTHER)
Admission: EM | Admit: 2013-11-15 | Discharge: 2013-11-15 | Disposition: A | Discharge status: Home / Self Care | Payer: PRIVATE HEALTH INSURANCE | Source: Home / Self Care

## 2013-11-15 ENCOUNTER — Encounter: Payer: Self-pay | Admitting: Emergency Medicine

## 2013-11-15 DIAGNOSIS — A491 Streptococcal infection, unspecified site: Secondary | ICD-10-CM

## 2013-11-15 LAB — BASIC METABOLIC PANEL
BUN: 15 mg/dL (ref 6–23)
CALCIUM: 9 mg/dL (ref 8.4–10.5)
CHLORIDE: 100 meq/L (ref 96–112)
CO2: 25 meq/L (ref 19–32)
CREATININE: 0.85 mg/dL (ref 0.50–1.35)
GLUCOSE: 87 mg/dL (ref 70–99)
Potassium: 3.9 mEq/L (ref 3.5–5.3)
Sodium: 139 mEq/L (ref 135–145)

## 2013-11-15 MED ORDER — CEFTRIAXONE SODIUM 1 G IJ SOLR
1.0000 g | Freq: Once | INTRAMUSCULAR | Status: AC
  Administered 2013-11-15: 1 g via INTRAMUSCULAR

## 2013-11-15 MED ORDER — AZITHROMYCIN 250 MG PO TABS: ORAL_TABLET | ORAL | Status: DC

## 2013-11-15 NOTE — ED Provider Notes (Signed)
CSN: [id number]     Arrival date & time 11/15/13  1001 History   None    Chief Complaint  Patient presents with  . Injections   (Consider location/radiation/quality/duration/timing/severity/associated sxs/prior Treatment) HPI Nursing visit.  See below.  Past Medical History  Diagnosis Date  . Depression    History reviewed. No pertinent past surgical history. History reviewed. No pertinent family history. History  Substance Use Topics  . Smoking status: Current Every Day Smoker -- 1.00 packs/day for 25 years    Types: Cigarettes  . Smokeless tobacco: Not on file  . Alcohol Use: Yes    Review of Systems  Allergies  Review of patient's allergies indicates no known allergies.  Home Medications   Prior to Admission medications   Medication Sig Start Date End Date Taking? Authorizing Provider  azithromycin (ZITHROMAX Z-PAK) 250 MG tablet Take 2 tabs today; then begin one tab once daily for 4 more days. 08/14/13   Lattie HawStephen A Beese, MD  benzonatate (TESSALON) 200 MG capsule Take 1 capsule (200 mg total) by mouth at bedtime. Take as needed for cough 08/14/13   Lattie HawStephen A Beese, MD  cetirizine (ZYRTEC) 10 MG tablet Take 10 mg by mouth daily.    Historical Provider, MD  guaiFENesin (MUCINEX) 600 MG 12 hr tablet Take by mouth 2 (two) times daily.    Historical Provider, MD  HYDROcodone-acetaminophen (NORCO/VICODIN) 5-325 MG per tablet Take one by mouth at bedtime as needed for pain 11/05/13   Lattie HawStephen A Beese, MD  lidocaine (XYLOCAINE) 2 % solution Use as directed 15 mLs in the mouth or throat 4 (four) times daily -  before meals and at bedtime. Gargle, then expectorate 11/05/13   Lattie HawStephen A Beese, MD  naproxen (NAPROSYN) 500 MG tablet Take 1 tablet (500 mg total) by mouth 2 (two) times daily with a meal. 10/02/13   Daine GipErin L Voss, MD  penicillin v potassium (VEETID) 500 MG tablet Take one tab by mouth twice daily for 10 days 11/03/13   Lattie HawStephen A Beese, MD  phenylephrine (NEO-SYNEPHRINE) 1 % nasal  spray Place 1 drop into both nostrils every 6 (six) hours as needed for congestion.    Historical Provider, MD   BP 121/85  Pulse 87  Temp(Src) 97.5 F (36.4 C) (Oral)  Ht 6\' 1"  (1.854 m)  Wt 223 lb (101.152 kg)  BMI 29.43 kg/m2  SpO2 96% Physical Exam  ED Course  Procedures (including critical care time) Labs Review Labs Reviewed - No data to display  Imaging Review No results found.   MDM   1. Streptococcal infection    Patient seen as a nursing visit for his second Rocephin shot.  Most of them had Z-Pak to his regimen.  Advised to change toothbrush.  I suspect that he continues to have strep throat secondary to reducing his toothbrush into his mouth and not really changing it however he may have penicillin resistant strep throat which will culture may or may not show.  Followup if not improving after 2 rounds of Rocephin and Z-Pak.  Marlaine HindJeffrey H Henderson, MD 11/15/13 1040

## 2013-11-15 NOTE — ED Notes (Signed)
Edward RuizJohn is here for his 2nd Rocephin injection. He reports he is not much better. He tolerated injection well with no complications.

## 2013-11-16 ENCOUNTER — Telehealth: Payer: Self-pay | Admitting: Emergency Medicine

## 2013-11-16 LAB — CULTURE, GROUP A STREP

## 2013-11-19 ENCOUNTER — Telehealth: Payer: Self-pay | Admitting: Emergency Medicine

## 2014-03-31 ENCOUNTER — Ambulatory Visit (INDEPENDENT_AMBULATORY_CARE_PROVIDER_SITE_OTHER): Payer: No Typology Code available for payment source | Admitting: Physician Assistant

## 2014-03-31 VITALS — BP 119/80 | HR 66 | Temp 98.3°F | Resp 20 | Ht 72.75 in | Wt 232.8 lb

## 2014-03-31 DIAGNOSIS — J069 Acute upper respiratory infection, unspecified: Secondary | ICD-10-CM

## 2014-03-31 MED ORDER — BENZONATATE 100 MG PO CAPS: 100.0000 mg | ORAL_CAPSULE | Freq: Three times a day (TID) | ORAL | Status: DC | PRN

## 2014-03-31 MED ORDER — AMOXICILLIN-POT CLAVULANATE 875-125 MG PO TABS: 1.0000 | ORAL_TABLET | Freq: Two times a day (BID) | ORAL | Status: DC

## 2014-03-31 NOTE — Patient Instructions (Signed)
Continue Mucinex and flonase. Increase water intake to 64 oz/day.

## 2014-03-31 NOTE — Progress Notes (Signed)
Subjective:    Patient ID: Edward SabinsJohn Doyle, male    DOB: 08/22/1970, 43 y.o.   MRN: [id number]  HPI Patient presents with 1 week of a productive cough that is accompanied with rhinorrhea, postnasal drip, congestion, sore throat, and R ear pain. Denies sinus pressure, fever, chills, or HA. Feels like he has to cough more mucus up, but gets stuck. Sick contacts include daughter and an immunocompromised granddaughter with RSV. Both live in the home. Has tried flonase and mucinex with some relief. Does not drink much water. No h/o asthma or allergies. Quit smoking 3 weeks ago (03/12/14) after smoking a pack/day for 25 years. Had help with Chantix, but is no longer taking it.   Review of Systems  Constitutional: Negative for fever, chills, diaphoresis, activity change, appetite change and fatigue.  HENT: Positive for congestion, postnasal drip, rhinorrhea and sore throat. Negative for dental problem, ear discharge, ear pain, sinus pressure and sneezing.   Eyes: Negative for pain, discharge, redness and itching.       Eyes always dry  Respiratory: Positive for cough (productive). Negative for chest tightness and wheezing.   Cardiovascular: Negative for chest pain and palpitations.  Gastrointestinal: Negative for nausea, vomiting and abdominal pain.  Musculoskeletal: Negative for neck pain and neck stiffness.  Allergic/Immunologic: Negative for environmental allergies and food allergies.  Neurological: Negative for dizziness, light-headedness and headaches.  Hematological: Negative for adenopathy.       Objective:   Physical Exam  Constitutional: He appears well-developed and well-nourished. No distress.  Blood pressure 119/80, pulse 66, temperature 98.3 F (36.8 C), temperature source Oral, resp. rate 20, height 6' 0.75" (1.848 m), weight 232 lb 12.8 oz (105.597 kg), SpO2 97 %.   HENT:  Head: Normocephalic and atraumatic.  Right Ear: Tympanic membrane, external ear and ear canal normal.  Left  Ear: Tympanic membrane, external ear and ear canal normal.  Nose: Mucosal edema and rhinorrhea present. No sinus tenderness. Right sinus exhibits no maxillary sinus tenderness and no frontal sinus tenderness. Left sinus exhibits no maxillary sinus tenderness and no frontal sinus tenderness.  Mouth/Throat: Uvula is midline and mucous membranes are normal. Posterior oropharyngeal erythema present. No oropharyngeal exudate or posterior oropharyngeal edema.  2+ hypertrophic tonsils (baseline)  Eyes: Conjunctivae are normal. Pupils are equal, round, and reactive to light. Right eye exhibits no discharge. Left eye exhibits no discharge. No scleral icterus.  Neck: Normal range of motion. Neck supple.  Cardiovascular: Normal rate, regular rhythm and normal heart sounds.  Exam reveals no gallop and no friction rub.   No murmur heard. Pulmonary/Chest: Effort normal and breath sounds normal. He has no wheezes. He has no rales.  Abdominal: Soft. Bowel sounds are normal. There is no tenderness.  Lymphadenopathy:    He has no cervical adenopathy.  Skin: Skin is warm and dry. No rash noted. He is not diaphoretic. No erythema. No pallor.        Assessment & Plan:  1. Upper respiratory infection With immunocompromised child in home, have lower threshold to tx with antibx. Should continue mucinex and flonase. Increase water intake to 64 oz/day. - benzonatate (TESSALON) 100 MG capsule; Take 1-2 capsules (100-200 mg total) by mouth 3 (three) times daily as needed for cough.  Dispense: 40 capsule; Refill: 0 - amoxicillin-clavulanate (AUGMENTIN) 875-125 MG per tablet; Take 1 tablet by mouth 2 (two) times daily.  Dispense: 20 tablet; Refill: 0   Sharmarke Cicio PA-C  [facility]  03/31/2014 10:08 PM

## 2014-04-02 NOTE — Progress Notes (Signed)
I have discussed this case with Ms. Brewington, PA-C and agree.  

## 2014-06-30 ENCOUNTER — Ambulatory Visit (INDEPENDENT_AMBULATORY_CARE_PROVIDER_SITE_OTHER): Payer: No Typology Code available for payment source | Admitting: Urgent Care

## 2014-06-30 VITALS — BP 120/72 | HR 83 | Temp 98.5°F | Resp 18 | Ht 74.0 in | Wt 239.0 lb

## 2014-06-30 DIAGNOSIS — R0981 Nasal congestion: Secondary | ICD-10-CM

## 2014-06-30 DIAGNOSIS — J309 Allergic rhinitis, unspecified: Secondary | ICD-10-CM | POA: Diagnosis not present

## 2014-06-30 DIAGNOSIS — R05 Cough: Secondary | ICD-10-CM

## 2014-06-30 DIAGNOSIS — R059 Cough, unspecified: Secondary | ICD-10-CM

## 2014-06-30 MED ORDER — HYDROCODONE-HOMATROPINE 5-1.5 MG/5ML PO SYRP: 5.0000 mL | ORAL_SOLUTION | Freq: Every evening | ORAL | Status: DC | PRN

## 2014-06-30 MED ORDER — CETIRIZINE HCL 10 MG PO TABS: 10.0000 mg | ORAL_TABLET | Freq: Every day | ORAL | Status: DC

## 2014-06-30 MED ORDER — FLUTICASONE PROPIONATE 50 MCG/ACT NA SUSP: 2.0000 | Freq: Every day | NASAL | Status: DC

## 2014-06-30 NOTE — Progress Notes (Signed)
    MRN: [id number] DOB: 04/07/1971  Subjective:   Edward SabinsJohn Doyle is a 44 y.o. male presenting for chief complaint of Cough and Nasal Congestion  Reports 3 day history of nasal congestion, productive cough with some light green sputum worse in the morning, sneezing, rhinorrhea, chest congestion, scratchy throat. Reports history of seasonal allergies but has not been consistent with his medication including nasal steroid and Allegra. Has tried Mucinex, Tessalon with some help but no resolution of symptoms. Patient is worried that he will pass a bacterial infection to his newborn grandchild. Of note, patient has difficulty with weather changes, states that cold dry air last week and now warm humid air this weekend has really made him feel congested. His family also has a cat which he is allergic to and unable to keep out of his bedroom. Denies any other aggravating or relieving factors, no other questions or concerns.  Edward Doyle has a current medication list which includes the following prescription(s): benzonatate. He has No Known Allergies.  Edward Doyle  has a past medical history of Depression. Also  has no past surgical history on file.  ROS As in subjective.  Objective:   Vitals: BP 120/72 mmHg  Pulse 83  Temp(Src) 98.5 F (36.9 C) (Oral)  Resp 18  Ht 6\' 2"  (1.88 m)  Wt 239 lb (108.41 kg)  BMI 30.67 kg/m2  SpO2 96%  Physical Exam  Constitutional: He is well-developed, well-nourished, and in no distress.  HENT:  TM's intact bilaterally, no effusions or erythema. Nasal turbinates inflamed, boggy and edematous bilaterally with clear-yellow rhinorrhea. No sinus tenderness. Postnasal drip present, tonsils mildly enlarged with 1 tonsillar exudate over right tonsil, no erythema.  Eyes: Conjunctivae are normal. Right eye exhibits no discharge. Left eye exhibits no discharge. No scleral icterus.  Cardiovascular: Normal rate, regular rhythm and intact distal pulses.  Exam reveals no gallop and no  friction rub.   No murmur heard. Pulmonary/Chest: Effort normal and breath sounds normal. No stridor. No respiratory distress. He has no wheezes. He has no rales. He exhibits no tenderness.  Lymphadenopathy:    He has no cervical adenopathy.  Skin: Skin is warm and dry. No rash noted. No erythema. No pallor.   Assessment and Plan :   1. Allergic rhinitis, unspecified allergic rhinitis type 2. Nasal congestion 3. Cough - Uncontrolled allergies, advised to start zyrtec daily, Flonase daily for at least 4 weeks as we enter spring. Recommended adequate hydration at least 64 ounces of water. Advised to keep family cat out of bedroom as much as possible. - Follow up in 5 days if symptoms worsen or fail to resolve, otherwise follow up as needed.  Wallis BambergMario Edouard Gikas, PA-C [facility] [phone] 06/30/2014 8:43 PM

## 2014-06-30 NOTE — Patient Instructions (Signed)

## 2014-09-05 ENCOUNTER — Encounter: Payer: Self-pay | Admitting: *Deleted

## 2014-09-05 ENCOUNTER — Emergency Department (INDEPENDENT_AMBULATORY_CARE_PROVIDER_SITE_OTHER)
Admission: EM | Admit: 2014-09-05 | Discharge: 2014-09-05 | Disposition: A | Discharge status: Home / Self Care | Payer: PRIVATE HEALTH INSURANCE | Source: Home / Self Care | Attending: Emergency Medicine | Admitting: Emergency Medicine

## 2014-09-05 DIAGNOSIS — J0100 Acute maxillary sinusitis, unspecified: Secondary | ICD-10-CM

## 2014-09-05 DIAGNOSIS — M940 Chondrocostal junction syndrome [Tietze]: Secondary | ICD-10-CM | POA: Diagnosis not present

## 2014-09-05 DIAGNOSIS — H65111 Acute and subacute allergic otitis media (mucoid) (sanguinous) (serous), right ear: Secondary | ICD-10-CM | POA: Diagnosis not present

## 2014-09-05 DIAGNOSIS — S20212A Contusion of left front wall of thorax, initial encounter: Secondary | ICD-10-CM | POA: Diagnosis not present

## 2014-09-05 HISTORY — DX: Other allergic rhinitis: J30.89

## 2014-09-05 MED ORDER — AMOXICILLIN 875 MG PO TABS: ORAL_TABLET | ORAL | Status: DC

## 2014-09-05 MED ORDER — FLUTICASONE PROPIONATE 50 MCG/ACT NA SUSP: NASAL | Status: DC

## 2014-09-05 MED ORDER — IBUPROFEN 200 MG PO TABS: ORAL_TABLET | ORAL | Status: DC
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
## 2014-12-24 ENCOUNTER — Emergency Department (INDEPENDENT_AMBULATORY_CARE_PROVIDER_SITE_OTHER)
Admission: EM | Admit: 2014-12-24 | Discharge: 2014-12-24 | Disposition: A | Discharge status: Home / Self Care | Payer: PRIVATE HEALTH INSURANCE | Source: Home / Self Care | Attending: Family Medicine | Admitting: Family Medicine

## 2014-12-24 ENCOUNTER — Encounter: Payer: Self-pay | Admitting: Emergency Medicine

## 2014-12-24 DIAGNOSIS — H6501 Acute serous otitis media, right ear: Secondary | ICD-10-CM

## 2014-12-24 MED ORDER — BENZONATATE 100 MG PO CAPS: 100.0000 mg | ORAL_CAPSULE | Freq: Three times a day (TID) | ORAL | Status: DC

## 2014-12-24 MED ORDER — AMOXICILLIN-POT CLAVULANATE 875-125 MG PO TABS: 1.0000 | ORAL_TABLET | Freq: Two times a day (BID) | ORAL | Status: DC

## 2014-12-24 NOTE — ED Notes (Signed)
Pt c/o fever, body aches, nasal congestion and cough with mucous x4 days.

## 2014-12-24 NOTE — Discharge Instructions (Signed)
°  Please take antibiotics as prescribed and be sure to complete entire course even if you start to feel better to ensure infection does not come back. ° °

## 2014-12-24 NOTE — ED Provider Notes (Signed)
CSN: [id number]     Arrival date & time 12/24/14  1912 History   First MD Initiated Contact with Patient 12/24/14 1946     Chief Complaint  Patient presents with  . Nasal Congestion  . Cough   (Consider location/radiation/quality/duration/timing/severity/associated sxs/prior Treatment) HPI Pt is a 44yo mae presenting to [facility] with c/o gradually worsening 4 day hx of  URI symptoms with moderate to severe nasal congestion, bilateral ear pain and pressure, mild to moderate sore throat, body aches and fever Tmax 102.  Fever has since resolved however, nasal congestion and ear pain worsening. He has tried sudafed and mucinex with minimal relief but pt states he stopped the Mucinex as he developed a bad taste in his mouth. Pt states several family members have also been sick at home since last week. No recent travel. Denies CP or SOB. Denies hx of asthma.  Past Medical History  Diagnosis Date  . Depression   . Environmental and seasonal allergies    History reviewed. No pertinent past surgical history. History reviewed. No pertinent family history. Social History  Substance Use Topics  . Smoking status: Former Smoker -- 1.00 packs/day for 25 years    Types: Cigarettes    Start date: 03/10/2014    Quit date: 03/12/2014  . Smokeless tobacco: Never Used  . Alcohol Use: 0.0 oz/week    0 Standard drinks or equivalent per week    Review of Systems  Constitutional: Positive for fever, chills and fatigue.  HENT: Positive for congestion, ear pain, rhinorrhea, sinus pressure and sore throat. Negative for trouble swallowing and voice change.   Respiratory: Positive for cough. Negative for shortness of breath.   Cardiovascular: Negative for chest pain and palpitations.  Gastrointestinal: Negative for nausea, vomiting, abdominal pain and diarrhea.  Musculoskeletal: Negative for myalgias, back pain and arthralgias.  Skin: Negative for rash.    Allergies  Review of patient's allergies indicates no  known allergies.  Home Medications   Prior to Admission medications   Medication Sig Start Date End Date Taking? Authorizing Provider  amoxicillin (AMOXIL) 875 MG tablet Take 1 twice a day X 10 days. 09/05/14   Lajean Manes, MD  amoxicillin-clavulanate (AUGMENTIN) 875-125 MG per tablet Take 1 tablet by mouth 2 (two) times daily. One po bid x 7 days 12/24/14   Junius Finner, PA-C  benzonatate (TESSALON) 100 MG capsule Take 1 capsule (100 mg total) by mouth every 8 (eight) hours. 12/24/14   Junius Finner, PA-C  cetirizine (ZYRTEC) 10 MG tablet Take 1 tablet (10 mg total) by mouth daily. 06/30/14   Wallis Bamberg, PA-C  fluticasone [facility]) 50 MCG/ACT nasal spray 1 or 2 sprays each nostril twice a day 09/05/14   Lajean Manes, MD  ibuprofen (ADVIL,MOTRIN) 200 MG tablet Take three tablets ( 600 milligrams total) every 6 with food as needed for pain. 09/05/14   Lajean Manes, MD   BP 113/80 mmHg  Pulse 84  Temp(Src) 98.7 F (37.1 C) (Oral)  SpO2 96% Physical Exam  Constitutional: He appears well-developed and well-nourished.  HENT:  Head: Normocephalic and atraumatic.  Right Ear: Hearing, external ear and ear canal normal. No drainage, swelling or tenderness. No mastoid tenderness. Tympanic membrane is erythematous and bulging.  Left Ear: Hearing, external ear and ear canal normal. No mastoid tenderness. Tympanic membrane is erythematous. Tympanic membrane is not retracted and not bulging.  Nose: Mucosal edema present. Right sinus exhibits no maxillary sinus tenderness and no frontal sinus tenderness. Left sinus exhibits no maxillary  sinus tenderness and no frontal sinus tenderness.  Mouth/Throat: Uvula is midline and mucous membranes are normal. Posterior oropharyngeal erythema present. No oropharyngeal exudate, posterior oropharyngeal edema or tonsillar abscesses.  Eyes: Conjunctivae are normal. No scleral icterus.  Neck: Normal range of motion. Neck supple.  Cardiovascular: Normal rate, regular rhythm  and normal heart sounds.   Pulmonary/Chest: Effort normal and breath sounds normal. No respiratory distress. He has no wheezes. He has no rales. He exhibits no tenderness.  Abdominal: Soft. Bowel sounds are normal. He exhibits no distension and no mass. There is no tenderness. There is no rebound and no guarding.  Musculoskeletal: Normal range of motion.  Neurological: He is alert.  Skin: Skin is warm and dry.  Nursing note and vitals reviewed.   ED Course  Procedures (including critical care time) Labs Review Labs Reviewed - No data to display  Imaging Review No results found.   MDM   1. Right acute serous otitis media, recurrence not specified     Pt c/o worsening URI symptoms. Pt is afebrile. Exam concerning for Right AOM w/o evidence of mastoiditis.  Rx: augmentin 875-125mg  BID for 7 days. Tessalon.  Advised ptto use acetaminophen and ibuprofen as needed for fever and pain. Encouraged rest and fluids. F/u with PCP in 1 week if not improving, sooner if worsening.  Pt verbalized understanding and agreement with tx plan.     Junius Finner, PA-C 12/25/14 [phone]

## 2015-01-12 ENCOUNTER — Emergency Department (INDEPENDENT_AMBULATORY_CARE_PROVIDER_SITE_OTHER)
Admission: EM | Admit: 2015-01-12 | Discharge: 2015-01-12 | Disposition: A | Discharge status: Home / Self Care | Payer: PRIVATE HEALTH INSURANCE | Source: Home / Self Care | Attending: Family Medicine | Admitting: Family Medicine

## 2015-01-12 ENCOUNTER — Encounter: Payer: Self-pay | Admitting: *Deleted

## 2015-01-12 DIAGNOSIS — K219 Gastro-esophageal reflux disease without esophagitis: Secondary | ICD-10-CM | POA: Diagnosis not present

## 2015-01-12 DIAGNOSIS — K122 Cellulitis and abscess of mouth: Secondary | ICD-10-CM | POA: Diagnosis not present

## 2015-01-12 DIAGNOSIS — J029 Acute pharyngitis, unspecified: Secondary | ICD-10-CM

## 2015-01-12 HISTORY — DX: Hyperlipidemia, unspecified: E78.5

## 2015-01-12 LAB — POCT RAPID STREP A (OFFICE): RAPID STREP A SCREEN: NEGATIVE

## 2015-01-12 NOTE — ED Provider Notes (Signed)
CSN: [id number]     Arrival date & time 01/12/15  0932 History   First MD Initiated Contact with Patient 01/12/15 1046     Chief Complaint  Patient presents with  . Sore Throat      HPI Comments: Patient reports that his previous URI symptoms improved (see note 12/24/14). Yesterday he experienced increased nasal congestion, and had mild sore throat this morning. He complains of recurring brief episodes (5 to 10 minutes) of anterior chest pain that radiates to his back.  These episodes occur 3 to 5 times per month at rest, and not with activity.  He admits that he has a daily morning cough and phlegm in his throat that resolves upon awakening.  He is presently taking omeprazole occasionally. He reports recent EKG and cardiac stress test at the [facility] negative. Family history of stroke in his grandmother.   Past Medical History  Diagnosis Date  . Depression   . Environmental and seasonal allergies   . Hyperlipidemia    Past Surgical History  Procedure Laterality Date  . Leg surgery Right    History reviewed. No pertinent family history. Social History  Substance Use Topics  . Smoking status: Former Smoker -- 1.00 packs/day for 25 years    Types: Cigarettes    Start date: 03/10/2014    Quit date: 03/12/2014  . Smokeless tobacco: Never Used  . Alcohol Use: 0.0 oz/week    0 Standard drinks or equivalent per week    Review of Systems + sore throat + cough + chest pain, intermittent No wheezing + nasal congestion + post-nasal drainage No sinus pain/pressure No itchy/red eyes No earache No hemoptysis No SOB No fever/chills No nausea No vomiting No abdominal pain No diarrhea No urinary symptoms No skin rash No fatigue No myalgias + headache Used OTC meds without relief  Allergies  Review of patient's allergies indicates no known allergies.  Home Medications   Prior to Admission medications   Not on File   Meds Ordered and Administered this Visit  Medications - No  data to display  BP 123/82 mmHg  Pulse 62  Temp(Src) 97.8 F (36.6 C) (Oral)  Resp 18  Ht 6\' 1"  (1.854 m)  Wt 238 lb (107.956 kg)  BMI 31.41 kg/m2  SpO2 98% No data found.   Physical Exam Nursing notes and Vital Signs reviewed. Appearance:  Patient appears stated age, and in no acute distress.  Patient is obese (BMI 31.4) Eyes:  Pupils are equal, round, and reactive to light and accomodation.  Extraocular movement is intact.  Conjunctivae are not inflamed  Ears:  Canals normal.  Tympanic membranes normal.  Nose:  Normal turbinates.  No sinus tenderness.  Pharynx:  Uvula mildly edematous, but pharynx otherwise normal. Neck:  Supple.  No adenopathy Lungs:  Clear to auscultation.  Breath sounds are equal.  Moving air well. Heart:  Regular rate and rhythm without murmurs, rubs, or gallops.  Abdomen:  Nontender without masses or hepatosplenomegaly.  Bowel sounds are present.  No CVA or flank tenderness.  Extremities:  No edema.  No calf tenderness Skin:  No rash present.   ED Course  Procedures none   Labs Reviewed  POCT RAPID STREP A (OFFICE) negative      MDM   1. Uvulitis   2. Gastroesophageal reflux disease, esophagitis presence not specified      Throat culture pending. Take omeprazole, 40mg  at bedtime (two 20mg  caps, or one 40mg  cap). Reflux precautions discussed. Followup with Family  Doctor if not improved in one week.    Lattie Haw, MD 01/20/15 [phone]

## 2015-01-12 NOTE — ED Notes (Signed)
Pt c/o uvula swelling x today. Denies fever. Reports post nasal drip last night.

## 2015-01-12 NOTE — Discharge Instructions (Signed)
Take omeprazole,  at bedtime (two  caps, or one  cap).   Gastroesophageal Reflux Disease, Adult Gastroesophageal reflux disease (GERD) happens when acid from your stomach flows up into the esophagus. When acid comes in contact with the esophagus, the acid causes soreness (inflammation) in the esophagus. Over time, GERD may create small holes (ulcers) in the lining of the esophagus. CAUSES   Increased body weight. This puts pressure on the stomach, making acid rise from the stomach into the esophagus.  Smoking. This increases acid production in the stomach.  Drinking alcohol. This causes decreased pressure in the lower esophageal sphincter (valve or ring of muscle between the esophagus and stomach), allowing acid from the stomach into the esophagus.  Late evening meals and a full stomach. This increases pressure and acid production in the stomach.  A malformed lower esophageal sphincter. Sometimes, no cause is found. SYMPTOMS   Burning pain in the lower part of the mid-chest behind the breastbone and in the mid-stomach area. This may occur twice a week or more often.  Trouble swallowing.  Sore throat.  Dry cough.  Asthma-like symptoms including chest tightness, shortness of breath, or wheezing. DIAGNOSIS  Your caregiver may be able to diagnose GERD based on your symptoms. In some cases, X-rays and other tests may be done to check for complications or to check the condition of your stomach and esophagus. TREATMENT  Your caregiver may recommend over-the-counter or prescription medicines to help decrease acid production. Ask your caregiver before starting or adding any new medicines.  HOME CARE INSTRUCTIONS   Change the factors that you can control. Ask your caregiver for guidance concerning weight loss, quitting smoking, and alcohol consumption.  Avoid foods and drinks that make your symptoms worse, such as:  Caffeine or alcoholic drinks.  Chocolate.  Peppermint or  mint flavorings.  Garlic and onions.  Spicy foods.  Citrus fruits, such as oranges, lemons, or limes.  Tomato-based foods such as sauce, chili, salsa, and pizza.  Fried and fatty foods.  Avoid lying down for the 3 hours prior to your bedtime or prior to taking a nap.  Eat small, frequent meals instead of large meals.  Wear loose-fitting clothing. Do not wear anything tight around your waist that causes pressure on your stomach.  Raise the head of your bed 6 to 8 inches with wood blocks to help you sleep. Extra pillows will not help.  Only take over-the-counter or prescription medicines for pain, discomfort, or fever as directed by your caregiver.  Do not take aspirin, ibuprofen, or other nonsteroidal anti-inflammatory drugs (NSAIDs). SEEK IMMEDIATE MEDICAL CARE IF:   You have pain in your arms, neck, jaw, teeth, or back.  Your pain increases or changes in intensity or duration.  You develop nausea, vomiting, or sweating (diaphoresis).  You develop shortness of breath, or you faint.  Your vomit is green, yellow, black, or looks like coffee grounds or blood.  Your stool is red, bloody, or black. These symptoms could be signs of other problems, such as heart disease, gastric bleeding, or esophageal bleeding. MAKE SURE YOU:   Understand these instructions.  Will watch your condition.  Will get help right away if you are not doing well or get worse. Document Released: 01/26/2005 Document Revised: 07/11/2011 Document Reviewed: 11/05/2010 [facility] Patient Information 2015 [place], [place]. This information is not intended to replace advice given to you by your health care provider. Make sure you discuss any questions you have with your health care provider.

## 2015-01-13 LAB — STREP A DNA PROBE: GASP: NEGATIVE

## 2015-01-14 ENCOUNTER — Telehealth: Payer: Self-pay | Admitting: *Deleted

## 2015-02-06 ENCOUNTER — Ambulatory Visit (INDEPENDENT_AMBULATORY_CARE_PROVIDER_SITE_OTHER): Payer: No Typology Code available for payment source | Admitting: Family Medicine

## 2015-02-06 DIAGNOSIS — J069 Acute upper respiratory infection, unspecified: Secondary | ICD-10-CM | POA: Diagnosis not present

## 2015-02-06 DIAGNOSIS — R05 Cough: Secondary | ICD-10-CM | POA: Diagnosis not present

## 2015-02-06 DIAGNOSIS — R059 Cough, unspecified: Secondary | ICD-10-CM

## 2015-02-06 DIAGNOSIS — H6502 Acute serous otitis media, left ear: Secondary | ICD-10-CM

## 2015-02-06 MED ORDER — AMOXICILLIN-POT CLAVULANATE 875-125 MG PO TABS: 1.0000 | ORAL_TABLET | Freq: Two times a day (BID) | ORAL | Status: DC

## 2015-02-06 MED ORDER — HYDROCODONE-HOMATROPINE 5-1.5 MG/5ML PO SYRP: 5.0000 mL | ORAL_SOLUTION | ORAL | Status: DC | PRN

## 2015-02-06 NOTE — Progress Notes (Signed)
Patient ID: Edward Doyle, maBexton HaakB: [birth year]  Age: 44 y.o. MRN: [id number]  Chief Complaint  Patient presents with  . Cough  . Nasal Congestion    Subjective:   Patient has had some problems with respiratory infections several times of late. He just gotten over a cold and then ear infection a couple of weeks ago. Then he got an upper respiratory infection and cough again. He has a little pain in his ears. He has been running a little fever, more earlier on. He continues to smoke. We had a long discussion about 5 minutes or so about his smoking. We talked about how it contributes to his problems. He has some children that come through his house and bring some infections with them also.  Current allergies, medications, problem list, past/family and social histories reviewed.  Objective:  BP 122/72 mmHg  Pulse 83  Temp(Src) 98.9 F (37.2 C) (Oral)  Resp 17  Ht 6' 0.5" (1.842 m)  Wt 232 lb (105.235 kg)  BMI 31.02 kg/m2  SpO2 97%  No major acute distress. Right TM looks normal. Left is mildly red. Throat clear. Neck supple without significant nodes. Chest clear. Heart regular without murmurs. Abdomen soft without mass or tenderness.  Assessment & Plan:   Assessment: 1. Acute serous otitis media of left ear, recurrence not specified   2. Acute upper respiratory infection   3. Cough       Plan: Will treat for the respiratory tract infection and otitis. Return if worse. Patient strongly refuses a flu shot.  Her long talk about stopping smoking.  No orders of the defined types were placed in this encounter.    Meds ordered this encounter  Medications  . cetirizine (ZYRTEC) 10 MG tablet    Sig: Take 10 mg by mouth daily.  Marland Kitchen amoxicillin-clavulanate (AUGMENTIN) 875-125 MG tablet    Sig: Take 1 tablet by mouth 2 (two) times daily.    Dispense:  20 tablet    Refill:  0  . HYDROcodone-homatropine (HYCODAN) 5-1.5 MG/5ML syrup    Sig: Take 5 mLs by mouth every 4 (four) hours as  needed.    Dispense:  120 mL    Refill:  0         Patient Instructions  Drink plenty of fluids and get enough rest  Take the Augmentin (amoxicillin/clavulanate) 875 mg one pill twice daily for infection  Take the Hycodan cough syrup 1 teaspoon every 4-6 hours as needed for cough  Continue using your Zyrtec (cetirizine) as needed  Return if worse at any time  Recommend picking a quit date, focusing on that, writing out your taper schedule, trying to get down to about 6 or 8 cigarettes and quitting. Plans to self you will never touch another one.       Return when necessary  HOPPER,DAVID, MD 02/06/2015

## 2015-02-06 NOTE — Patient Instructions (Signed)
Drink plenty of fluids and get enough rest  Take the Augmentin (amoxicillin/clavulanate) 875 mg one pill twice daily for infection  Take the Hycodan cough syrup 1 teaspoon every 4-6 hours as needed for cough  Continue using your Zyrtec (cetirizine) as needed  Return if worse at any time  Recommend picking a quit date, focusing on that, writing out your taper schedule, trying to get down to about 6 or 8 cigarettes and quitting. Plans to self you will never touch another one.

## 2015-05-24 ENCOUNTER — Ambulatory Visit (INDEPENDENT_AMBULATORY_CARE_PROVIDER_SITE_OTHER): Payer: No Typology Code available for payment source | Admitting: Family Medicine

## 2015-05-24 VITALS — BP 134/82 | HR 71 | Temp 98.5°F | Resp 18 | Ht 73.0 in | Wt 238.4 lb

## 2015-05-24 DIAGNOSIS — H6502 Acute serous otitis media, left ear: Secondary | ICD-10-CM | POA: Diagnosis not present

## 2015-05-24 DIAGNOSIS — R059 Cough, unspecified: Secondary | ICD-10-CM

## 2015-05-24 DIAGNOSIS — J069 Acute upper respiratory infection, unspecified: Secondary | ICD-10-CM | POA: Diagnosis not present

## 2015-05-24 DIAGNOSIS — R05 Cough: Secondary | ICD-10-CM | POA: Diagnosis not present

## 2015-05-24 MED ORDER — AMOXICILLIN-POT CLAVULANATE 875-125 MG PO TABS: 1.0000 | ORAL_TABLET | Freq: Two times a day (BID) | ORAL | Status: DC

## 2015-05-24 MED ORDER — HYDROCODONE-HOMATROPINE 5-1.5 MG/5ML PO SYRP: 5.0000 mL | ORAL_SOLUTION | ORAL | Status: DC | PRN

## 2015-05-24 NOTE — Progress Notes (Signed)
By signing my name below, I, Stann Ore, attest that this documentation has been prepared under the direction and in the presence of Elvina Sidle, MD. Electronically Signed: Stann Ore, Scribe. 05/24/2015 , 9:07 AM .  Patient was seen in room 4 .   Patient ID: Edward Doyle MRN: [id number], DOB: 27-Jun-1970, 45 y.o. Date of Encounter: 05/24/2015  Primary Physician: No PCP Per Patient  Chief Complaint:  Chief Complaint  Patient presents with  . Nasal Congestion    3 days--no  . Cough    productive cough--green mucous--with chest congestion  . Headache    with sinus pressure  . Chills  . Generalized Body Aches    HPI:  Edward Doyle is a 45 y.o. male who presents to [facility] complaining of nasal congestion with productive cough, sinus pressure and myalgia that started 3 days ago. He tried using neti pot with some relief. When he went to sleep, he wakes up coughing. He's a current smoker.   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   Past Medical History  Diagnosis Date  . Depression   . Environmental and seasonal allergies   . Hyperlipidemia      Home Meds: Prior to Admission medications   Medication Sig Start Date End Date Taking? Authorizing Provider  cetirizine (ZYRTEC) 10 MG tablet Take 10 mg by mouth daily. Reported on 05/24/2015    Historical Provider, MD    Allergies: No Known Allergies  Social History   Social History  . Marital Status: Married    Spouse Name: N/A  . Number of Children: N/A  . Years of Education: N/A   Occupational History  . Not on file.   Social History Main Topics  . Smoking status: Current Every Day Smoker -- 1.00 packs/day for 25 years    Types: Cigarettes  . Smokeless tobacco: Never Used  . Alcohol Use: 0.0 oz/week    0 Standard drinks or equivalent per week  . Drug Use: No  . Sexual Activity: Yes    Birth Control/ Protection: None   Other Topics Concern  . Not on file   Social History  Narrative     Review of Systems: Constitutional: negative for fever, night sweats, weight changes, or fatigue; positive for chills HEENT: negative for vision changes, hearing loss, rhinorrhea, ST, epistaxis; positive for congestion, sinus pressure Cardiovascular: negative for chest pain or palpitations Respiratory: negative for hemoptysis, wheezing, shortness of breath, or cough; positive for cough Abdominal: negative for abdominal pain, nausea, vomiting, diarrhea, or constipation Dermatological: negative for rash Neurologic: negative for dizziness, or syncope; positive for headache Musc: positive for myalgia All other systems reviewed and are otherwise negative with the exception to those above and in the HPI.  Physical Exam: Blood pressure 134/82, pulse 71, temperature 98.5 F (36.9 C), temperature source Oral, resp. rate 18, height  (1.854 m), weight 238 lb 6.4 oz (108.138 kg), SpO2 98 %., Body mass index is 31.46 kg/(m^2). General: Well developed, well nourished, in no acute distress. Head: Normocephalic, atraumatic, eyes without discharge, sclera non-icteric, nares are without discharge. Bilateral auditory canals clear. Oral cavity moist, posterior pharynx without exudate, erythema, peritonsillar abscess, or post nasal drip. Both tm's erythema Neck: Supple. No thyromegaly. Full ROM. No lymphadenopathy. Lungs: Clear bilaterally to auscultation without wheezes, rales, or rhonchi. Breathing is unlabored. Heart: RRR with S1 S2. No murmurs, rubs, or gallops appreciated. Abdomen: Soft, non-tender, non-distended with normoactive bowel sounds. No hepatomegaly. No rebound/guarding.  No obvious abdominal masses. Msk:  Strength and tone normal for age. Extremities/Skin: Warm and dry. No clubbing or cyanosis. No edema. No rashes or suspicious lesions. Neuro: Alert and oriented X 3. Moves all extremities spontaneously. Gait is normal. CNII-XII grossly in tact. Psych:  Responds to questions  appropriately with a normal affect.     ASSESSMENT AND PLAN:  45 y.o. year old male with recurrent sinusitis and otitis This chart was scribed in my presence and reviewed by me personally.    ICD-9-CM ICD-10-CM   1. Acute serous otitis media of left ear, recurrence not specified 381.01 H65.02 amoxicillin-clavulanate (AUGMENTIN) 875-125 MG tablet  2. Acute upper respiratory infection 465.9 J06.9 HYDROcodone-homatropine (HYCODAN) 5-1.5 MG/5ML syrup  3. Cough 786.2 R05 HYDROcodone-homatropine (HYCODAN) 5-1.5 MG/5ML syrup    Signed, Elvina Sidle, MD 05/24/2015 9:07 AM

## 2015-05-24 NOTE — Patient Instructions (Signed)

## 2015-09-12 ENCOUNTER — Ambulatory Visit (INDEPENDENT_AMBULATORY_CARE_PROVIDER_SITE_OTHER): Payer: No Typology Code available for payment source | Admitting: Physician Assistant

## 2015-09-12 VITALS — BP 118/80 | HR 79 | Temp 98.6°F | Resp 16 | Ht 73.0 in | Wt 241.0 lb

## 2015-09-12 DIAGNOSIS — E785 Hyperlipidemia, unspecified: Secondary | ICD-10-CM

## 2015-09-12 DIAGNOSIS — R059 Cough, unspecified: Secondary | ICD-10-CM

## 2015-09-12 DIAGNOSIS — K219 Gastro-esophageal reflux disease without esophagitis: Secondary | ICD-10-CM | POA: Diagnosis not present

## 2015-09-12 DIAGNOSIS — J302 Other seasonal allergic rhinitis: Secondary | ICD-10-CM | POA: Diagnosis not present

## 2015-09-12 DIAGNOSIS — Z720 Tobacco use: Secondary | ICD-10-CM

## 2015-09-12 DIAGNOSIS — R05 Cough: Secondary | ICD-10-CM

## 2015-09-12 DIAGNOSIS — F17200 Nicotine dependence, unspecified, uncomplicated: Secondary | ICD-10-CM

## 2015-09-12 MED ORDER — IPRATROPIUM BROMIDE 0.02 % IN SOLN
0.5000 mg | Freq: Once | RESPIRATORY_TRACT | Status: AC
  Administered 2015-09-12: 0.5 mg via RESPIRATORY_TRACT

## 2015-09-12 MED ORDER — FLUTICASONE PROPIONATE 50 MCG/ACT NA SUSP: 2.0000 | Freq: Every day | NASAL | Status: DC

## 2015-09-12 MED ORDER — CETIRIZINE HCL 10 MG PO TABS: 10.0000 mg | ORAL_TABLET | Freq: Every day | ORAL | Status: DC

## 2015-09-12 MED ORDER — ALBUTEROL SULFATE (2.5 MG/3ML) 0.083% IN NEBU
2.5000 mg | INHALATION_SOLUTION | Freq: Once | RESPIRATORY_TRACT | Status: AC
  Administered 2015-09-12: 2.5 mg via RESPIRATORY_TRACT

## 2015-09-12 MED ORDER — BENZONATATE 100 MG PO CAPS: 100.0000 mg | ORAL_CAPSULE | Freq: Three times a day (TID) | ORAL | Status: DC | PRN

## 2015-09-12 MED ORDER — GUAIFENESIN ER 1200 MG PO TB12: 1.0000 | ORAL_TABLET | Freq: Two times a day (BID) | ORAL | Status: DC | PRN

## 2015-09-12 NOTE — Progress Notes (Signed)
Patient ID: Edward Doyle, male    DOB: 11/16/1970, 45 y.o.   MRN: [id number]  PCP: No PCP Per Patient  Subjective:   Chief Complaint  Patient presents with  . Sinusitis    x 3 days  . chest congestion    x 3 days    HPI Presents for evaluation of congestion, drainage and cough x 3 days.  I probably wouldn't come, but I have to work. The drainage wont stop. Coughing, feels choked on the phlegm. Drainage is clear. Symptoms began after being very active ouside. Slept hard that night. The next day he had an itchy throat and itchiy, burning eyes. Those have stopped now that he has so much draiange. Mucous and cough are his primary concerns, as he relates them to causing a tightness sensation in his chest and reduces his endurance for walking at his job. Voice sounds different-his wife asked him if he was sick yesterday when talking on the pjone. Last spring he was ill with similar symptoms x 6 weeks, and ultimately developed a sinusitis. He has cetirizine at home, but doesn't use it regularly.  In addition, he mentions episodes of chest pain. They occur about once a week, and last 2-10 minutes. The pain starts in the center of the chest and can radiate to the back and neck/jaw on the RIGHT. He was evaluated by [facility] at the [facility]. EKG reportedly normal. Suspected GERD. Prescribed omeprazole, but didn't take it daily, and eventually stopped it all together.  He is a smoker, under considerable stress, and consumes caffeine. Minimal EtOH. He does have a history of hyperlipidemia, prescribed but never took statin therapy. He's to follow-up with his PCP soon, and the plan is to stress test him if his symptoms persist.    Review of Systems As above.    There are no active problems to display for this patient.    Prior to Admission medications   Not on File     No Known Allergies     Objective:  Physical Exam  Constitutional: He is oriented to person, place, and time. He  appears well-developed and well-nourished. No distress.  BP 118/80 mmHg  Pulse 79  Temp(Src) 98.6 F (37 C) (Oral)  Resp 16  Ht 6\' 1"  (1.854 m)  Wt 241 lb (109.317 kg)  BMI 31.80 kg/m2  SpO2 96%   HENT:  Head: Normocephalic and atraumatic.  Right Ear: Hearing, tympanic membrane, external ear and ear canal normal.  Left Ear: Hearing, tympanic membrane, external ear and ear canal normal.  Nose: Mucosal edema (moderate, dark pink colored turbinates) present.  No foreign bodies. Right sinus exhibits no maxillary sinus tenderness and no frontal sinus tenderness. Left sinus exhibits no maxillary sinus tenderness and no frontal sinus tenderness.  Mouth/Throat: Uvula is midline, oropharynx is clear and moist and mucous membranes are normal. No uvula swelling. No oropharyngeal exudate.  Eyes: Conjunctivae and EOM are normal. Pupils are equal, round, and reactive to light. Right eye exhibits no discharge. Left eye exhibits no discharge. No scleral icterus.  Neck: Trachea normal, normal range of motion and full passive range of motion without pain. Neck supple. No thyroid mass and no thyromegaly present.  Cardiovascular: Normal rate, regular rhythm and normal heart sounds.   Pulmonary/Chest: Effort normal and breath sounds normal.  Lymphadenopathy:       Head (right side): No submandibular, no tonsillar, no preauricular, no posterior auricular and no occipital adenopathy present.  Head (left side): No submandibular, no tonsillar, no preauricular and no occipital adenopathy present.    He has no cervical adenopathy.       Right: No supraclavicular adenopathy present.       Left: No supraclavicular adenopathy present.  Neurological: He is alert and oriented to person, place, and time. He has normal strength. No cranial nerve deficit or sensory deficit.  Skin: Skin is warm, dry and intact. No rash noted.  Psychiatric: He has a normal mood and affect. His speech is normal and behavior is normal.      Albuterol + Atrovent neb in the office resulted in subjective feeling of being "more opened up," and no change in the normal lung exam.       Assessment & Plan:   1. Cough Likely due to post-nasal drainage due to seasonal allergies, but may also be a symptom of GERD/LPR.  - albuterol (PROVENTIL) (2.5 MG/3ML) 0.083% nebulizer solution 2.5 mg; Take 3 mLs (2.5 mg total) by nebulization once. - ipratropium (ATROVENT) nebulizer solution 0.5 mg; Take 2.5 mLs (0.5 mg total) by nebulization once. - Guaifenesin (MUCINEX MAXIMUM STRENGTH) 1200 MG TB12; Take 1 tablet (1,200 mg total) by mouth every 12 (twelve) hours as needed.  Dispense: 14 tablet; Refill: 1 - benzonatate (TESSALON) 100 MG capsule; Take 1-2 capsules (100-200 mg total) by mouth 3 (three) times daily as needed for cough.  Dispense: 40 capsule; Refill: 0  2. Seasonal allergies Daily treatment with Flonase, PRN use of cetirizine.  - fluticasone (FLONASE) 50 MCG/ACT nasal spray; Place 2 sprays into both nostrils daily.  Dispense: 16 g; Refill: 12 - cetirizine (ZYRTEC) 10 MG tablet; Take 1 tablet (10 mg total) by mouth daily.  Dispense: 90 tablet; Refill: 3  3. Gastroesophageal reflux disease without esophagitis Advise resumption of PPI and follow-up with PCP as recommended.  4. Hyperlipidemia Follow-up with PCP.  5. Smoker Encouraged smoking cessation.   Fernande Bras, PA-C Physician Assistant-Certified [facility]

## 2015-09-12 NOTE — Patient Instructions (Addendum)
     IF you received an x-ray today, you will receive an invoice from [facility]. Please contact [facility] at [phone] with questions or concerns regarding your invoice.   IF you received labwork today, you will receive an invoice from [facility]. Please contact [facility] at [phone] with questions or concerns regarding your invoice.   Our billing staff will not be able to assist you with questions regarding bills from these companies.  You will be contacted with the lab results as soon as they are available. The fastest way to get your results is to activate your My Chart account. Instructions are located on the last page of this paperwork. If you have not heard from us regarding the results in 2 weeks, please contact this office.    Things that often make reflux symptoms worse: Caffeine Carbonation (soda) Spicy foods Acidic foods (like tomato sauce, orange juice, lemonade) Fatty foods (including whole milk and ice cream) Stress (feeling sad, worried, nervous) Nicotine Alcohol NSAIDS (non-steroidal anti-inflammatories, like ibuprofen (Advil, Motrin) or naproxen (Aleve)).   Did you know that you begin to benefit from quitting smoking within the first twenty minutes? It's TRUE.  At 20 minutes: -blood pressure decreases -pulse rate drops -body temperature of hands and feet increases  At 8 hours: -carbon monoxide level in blood drops to normal -oxygen level in blood increases to normal  At 24 hours: -the chance of heart attack decreases  At 48 hours: -nerve endings start regrowing -ability to smell and taste is enhanced  2 weeks-3 months: -circulation improves -walking becomes easier -lung function improves  1-9 months: -coughing, sinus congestion, fatigue and shortness of breath decreases  1 year: -excess risk of heart disease is decreased to HALF that of a smoker  5 years: Stroke risk is reduced to that of  people who have never smoked  10 years: -risk of lung cancer drops to as little as half that of continuing smokers -risk of cancer of the mouth, throat, esophagus, bladder, kidney and pancreas decreases -risk of ulcer decreases  15 years -risk of heart disease is now similar to that of people who have never smoked -risk of death returns to nearly the level of people who have never smoked

## 2015-09-28 ENCOUNTER — Encounter: Payer: Self-pay | Admitting: Emergency Medicine

## 2015-09-28 ENCOUNTER — Emergency Department
Admission: EM | Admit: 2015-09-28 | Discharge: 2015-09-28 | Disposition: A | Discharge status: Home / Self Care | Payer: PRIVATE HEALTH INSURANCE | Source: Home / Self Care | Attending: Family Medicine | Admitting: Family Medicine

## 2015-09-28 DIAGNOSIS — J039 Acute tonsillitis, unspecified: Secondary | ICD-10-CM | POA: Diagnosis not present

## 2015-09-28 DIAGNOSIS — R52 Pain, unspecified: Secondary | ICD-10-CM

## 2015-09-28 LAB — POCT RAPID STREP A (OFFICE): Rapid Strep A Screen: NEGATIVE

## 2015-09-28 MED ORDER — AMOXICILLIN 500 MG PO CAPS: 500.0000 mg | ORAL_CAPSULE | Freq: Two times a day (BID) | ORAL | Status: DC

## 2015-09-28 NOTE — Discharge Instructions (Signed)

## 2015-09-28 NOTE — ED Provider Notes (Signed)
CSN: [id number]     Arrival date & time 09/28/15  0849 History   First MD Initiated Contact with Patient 09/28/15 [phone]     Chief Complaint  Patient presents with  . Sore Throat   (Consider location/radiation/quality/duration/timing/severity/associated sxs/prior Treatment) HPI  The pt is [facility] 45[facility]/o sore throat for 2 days, gradually worsening, pain is aching and sore, 7/10 at worst, worse with swallowing.  She has taken acetaminophen and ibuprofen for pain w/o relief. No sick contacts or recent travel. Denies n/v/d, fever or chills. Hx of recurrent strep throat.  Past Medical History  Diagnosis Date  . Depression   . Environmental and seasonal allergies   . Hyperlipidemia    Past Surgical History  Procedure Laterality Date  . Leg surgery Right     hip, leg, ankle, shoulder in parachuting accident   No family history on file. Social History  Substance Use Topics  . Smoking status: Current Every Day Smoker -- 1.00 packs/day for 25 years    Types: Cigarettes  . Smokeless tobacco: Never Used     Comment: thinks about quitting every day  . Alcohol Use: 0.0 oz/week    0 Standard drinks or equivalent per week     Comment: occasionally    Review of Systems  Constitutional: Negative for fever and chills.  HENT: Positive for sore throat. Negative for congestion, ear pain, trouble swallowing and voice change.   Respiratory: Negative for cough and shortness of breath.   Cardiovascular: Negative for chest pain and palpitations.  Gastrointestinal: Negative for nausea, vomiting, abdominal pain and diarrhea.  Musculoskeletal: Positive for myalgias and arthralgias. Negative for back pain.       Body aches  Skin: Negative for rash.    Allergies  Review of patient's allergies indicates no known allergies.  Home Medications   Prior to Admission medications   Medication Sig Start Date End Date Taking? Authorizing Provider  amoxicillin (AMOXIL) 500 MG capsule  Take 1 capsule (500 mg total) by mouth 2 (two) times daily. For 10 days 09/28/15   Junius FinnerErin O'Malley, PA-C   Meds Ordered and Administered this Visit  Medications - No data to display  BP 125/83 mmHg  Pulse 82  Temp(Src) 98.8 F (37.1 C) (Oral)  Ht 6\' 1"  (1.854 m)  Wt 237 lb (107.502 kg)  BMI 31.27 kg/m2  SpO2 97% No data found.   Physical Exam  Constitutional: He appears well-developed and well-nourished.  HENT:  Head: Normocephalic and atraumatic.  Right Ear: Tympanic membrane normal.  Left Ear: Tympanic membrane normal.  Nose: Nose normal.  Mouth/Throat: Uvula is midline and mucous membranes are normal. No trismus in the jaw. Oropharyngeal exudate, posterior oropharyngeal edema and posterior oropharyngeal erythema present. No tonsillar abscesses.  Eyes: Conjunctivae are normal. No scleral icterus.  Neck: Normal range of motion. Neck supple.  Cardiovascular: Normal rate, regular rhythm and normal heart sounds.   Pulmonary/Chest: Effort normal and breath sounds normal. No stridor. No respiratory distress. He has no wheezes. He has no rales.  Abdominal: Soft. He exhibits no distension. There is no tenderness.  Musculoskeletal: Normal range of motion.  Lymphadenopathy:    He has cervical adenopathy.  Neurological: He is alert.  Skin: Skin is warm and dry.  Nursing note and vitals reviewed.   ED Course  Procedures (including critical care time)  Labs Review Labs Reviewed  POCT RAPID STREP A (OFFICE)    Imaging Review No results found.    MDM  1. Exudative tonsillitis   2. Body aches    Pt c/o sore throat and body aches. Exam c/w strep pharyngitis.  Rapid strep: negative, however, due to exam and hx of recurrent strep, will treat with antibiotics.  Rx: Amoxicillin Advised pt to use acetaminophen and ibuprofen as needed for fever and pain. Encouraged rest and fluids. F/u with PCP in 7-10 days if not improving, sooner if worsening. Pt verbalized understanding and  agreement with tx plan.   Junius Finner, PA-C 09/28/15 1008

## 2015-09-28 NOTE — ED Notes (Signed)
Pt c/o sore throat x 2 days, no URI sxs

## 2015-10-01 ENCOUNTER — Telehealth: Payer: Self-pay | Admitting: Emergency Medicine

## 2015-10-01 MED ORDER — CLINDAMYCIN HCL 300 MG PO CAPS: 300.0000 mg | ORAL_CAPSULE | Freq: Three times a day (TID) | ORAL | Status: DC

## 2015-10-01 NOTE — ED Notes (Signed)
  Dr Cathren HarshBeese will call in a change of antibiotics to take.

## 2015-10-01 NOTE — Addendum Note (Signed)
Addended by: Donna ChristenBEESE, Skylarr Liz A on: 10/01/2015 05:55 PM   Modules accepted: Orders, Disposition Section

## 2015-10-01 NOTE — ED Notes (Signed)
Patient called to say he is not improving on amoxicillin; throat actually worse; no fever; coughing greenish plegm; miserable.Information given to Dr.Beese.

## 2015-10-03 ENCOUNTER — Telehealth: Payer: Self-pay | Admitting: Radiology

## 2015-10-03 ENCOUNTER — Ambulatory Visit (INDEPENDENT_AMBULATORY_CARE_PROVIDER_SITE_OTHER): Payer: No Typology Code available for payment source | Admitting: Emergency Medicine

## 2015-10-03 ENCOUNTER — Ambulatory Visit (INDEPENDENT_AMBULATORY_CARE_PROVIDER_SITE_OTHER): Payer: No Typology Code available for payment source

## 2015-10-03 VITALS — BP 110/82 | HR 76 | Temp 98.0°F | Resp 16 | Ht 73.0 in | Wt 234.0 lb

## 2015-10-03 DIAGNOSIS — H6693 Otitis media, unspecified, bilateral: Secondary | ICD-10-CM | POA: Diagnosis not present

## 2015-10-03 DIAGNOSIS — J029 Acute pharyngitis, unspecified: Secondary | ICD-10-CM | POA: Diagnosis not present

## 2015-10-03 DIAGNOSIS — R059 Cough, unspecified: Secondary | ICD-10-CM

## 2015-10-03 DIAGNOSIS — R05 Cough: Secondary | ICD-10-CM | POA: Diagnosis not present

## 2015-10-03 LAB — POCT CBC
GRANULOCYTE PERCENT: 74.2 % (ref 37–80)
HCT, POC: 41.7 % — AB (ref 43.5–53.7)
HEMOGLOBIN: 14.9 g/dL (ref 14.1–18.1)
LYMPH, POC: 2.1 (ref 0.6–3.4)
MCH, POC: 29.9 pg (ref 27–31.2)
MCHC: 35.8 g/dL — AB (ref 31.8–35.4)
MCV: 83.5 fL (ref 80–97)
MID (cbc): 0.6 (ref 0–0.9)
MPV: 7.9 fL (ref 0–99.8)
POC GRANULOCYTE: 8 — AB (ref 2–6.9)
POC LYMPH PERCENT: 19.8 %L (ref 10–50)
POC MID %: 6 %M (ref 0–12)
Platelet Count, POC: 260 10*3/uL (ref 142–424)
RBC: 4.99 M/uL (ref 4.69–6.13)
RDW, POC: 12.9 %
WBC: 10.8 10*3/uL — AB (ref 4.6–10.2)

## 2015-10-03 LAB — POCT INFLUENZA A/B
INFLUENZA A, POC: NEGATIVE
Influenza B, POC: NEGATIVE

## 2015-10-03 MED ORDER — AZELASTINE HCL 0.1 % NA SOLN: NASAL | Status: DC

## 2015-10-03 MED ORDER — FLUTICASONE PROPIONATE 50 MCG/ACT NA SUSP: 2.0000 | Freq: Every day | NASAL | Status: DC

## 2015-10-03 NOTE — Progress Notes (Signed)
By signing my name below, I, Mesha Guinyard, attest that this documentation has been prepared under the direction and in the presence of Lesle Chris, MD.  Electronically Signed: Arvilla Market, Medical Scribe. 10/03/2015. 8:28 AM.  Chief Complaint:  Chief Complaint  Patient presents with  . Ear Pain    both but mostly on left    HPI: Edward Doyle is a 45 y.o. male who reports to [facility] today complaining of decreased hearing and left ear pain onset 8 days ago. When pt experienced myalgia and fever, he went to the Urgent Care in [place]. They performed a strep test which came back negative, and gave him amoxicillin; which he took [place]. (5 days gao) - Thurs. (2 days ago). When pt woke up Thurs. his throat was worse and he felt ill. Pt began to take clindamycin Friday(1 day ago) 3 times. The same day, pt experienced ear pain and thought his ear was about to bust. Pt has had some history of ear drainage. Pt denies having blocked ears before.   Pt works at night  Past Medical History  Diagnosis Date  . Depression   . Environmental and seasonal allergies   . Hyperlipidemia    Past Surgical History  Procedure Laterality Date  . Leg surgery Right     hip, leg, ankle, shoulder in parachuting accident   Social History   Social History  . Marital Status: Married    Spouse Name: Belenda Cruise  . Number of Children: 3  . Years of Education: 14+   Occupational History  . Maintenance Manager    Social History Main Topics  . Smoking status: Current Every Day Smoker -- 1.00 packs/day for 25 years    Types: Cigarettes  . Smokeless tobacco: Never Used     Comment: thinks about quitting every day  . Alcohol Use: 0.0 oz/week    0 Standard drinks or equivalent per week     Comment: occasionally  . Drug Use: No  . Sexual Activity: Yes    Birth Control/ Protection: None   Other Topics Concern  . None   Social History Narrative   Lives with his wife, son, daughter, step-daughter (and her  child).   One grandchild died of an autoimmune disorder complicated by RSV infection at age 81.5 years (07/2015).   History reviewed. No pertinent family history. No Known Allergies Prior to Admission medications   Medication Sig Start Date End Date Taking? Authorizing Provider  clindamycin (CLEOCIN) 300 MG capsule Take 1 capsule (300 mg total) by mouth 3 (three) times daily. 10/01/15  Yes Lattie Haw, MD  amoxicillin (AMOXIL) 500 MG capsule Take 1 capsule (500 mg total) by mouth 2 (two) times daily. For 10 days Patient not taking: Reported on 10/03/2015 09/28/15   Junius Finner, PA-C     ROS: The patient denies chills, night sweats, unintentional weight loss, chest pain, palpitations, wheezing, dyspnea on exertion, nausea, vomiting, abdominal pain, dysuria, hematuria, melena, numbness, weakness, or tingling. Pt experienced fevers.  All other systems have been reviewed and were otherwise negative with the exception of those mentioned in the HPI and as above.    PHYSICAL EXAM: Filed Vitals:   10/03/15 0823  BP: 110/82  Pulse: 76  Temp: 98 F (36.7 C)  Resp: 16   Body mass index is 30.88 kg/(m^2).   General: Alert, no acute distress. Oriented. Not ill appearing HEENT:  Normocephalic, atraumatic, oropharynx patent. Fluid behind both TM and distortion of the landmarks. Tonsils are 2+  with tonsillolith. Eye: Nonie H[facility]OMI, [facility] Cardiovascular:  Regular rate and rhythm, no rubs murmurs or gallops.  No Carotid bruits, radial pulse intact. No pedal edema.  Respiratory: Clear to auscultation bilaterally.  No wheezes, rales, or rhonchi.  No cyanosis, no use of accessory musculature Abdominal: No organomegaly, abdomen is soft and non-tender, positive bowel sounds.  No masses. Musculoskeletal: Gait intact. No edema, tenderness Skin: No rashes. Neurologic: Facial musculature symmetric. Psychiatric: Patient acts appropriately throughout our interaction. Lymphatic: No cervical or submandibular  lymphadenopathy   LABS: Results for orders placed or performed in visit on 10/03/15  POCT CBC  Result Value Ref Range   WBC 10.8 (A) 4.6 - 10.2 K/uL   Lymph, poc 2.1 0.6 - 3.4   POC LYMPH PERCENT 19.8 10 - 50 %L   MID (cbc) 0.6 0 - 0.9   POC MID % 6.0 0 - 12 %M   POC Granulocyte 8.0 (A) 2 - 6.9   Granulocyte percent 74.2 37 - 80 %G   RBC 4.99 4.69 - 6.13 M/uL   Hemoglobin 14.9 14.1 - 18.1 g/dL   HCT, POC 16.141.7 (A) 09.643.5 - 53.7 %   MCV 83.5 80 - 97 fL   MCH, POC 29.9 27 - 31.2 pg   MCHC 35.8 (A) 31.8 - 35.4 g/dL   RDW, POC 04.512.9 %   Platelet Count, POC 260 142 - 424 K/uL   MPV 7.9 0 - 99.8 fL  POCT Influenza A/B  Result Value Ref Range   Influenza A, POC Negative Negative   Influenza B, POC Negative Negative    EKG/XRAY:   Primary [facility]. Dg Sinus 1-2 Views  10/03/2015  CLINICAL DATA:  Cough, congestion EXAM: PARANASAL SINUSES - 1-2 VIEW COMPARISON:  None. FINDINGS: The paranasal sinus are aerated. There is no evidence of sinus opacification air-fluid levels or mucosal thickening. No significant bone abnormalities are seen. IMPRESSION: Negative. Electronically Signed   By: Charlett NoseKevin  Dover M.D.   On: 10/03/2015 09:52   Dg Chest 2 View  10/03/2015  CLINICAL DATA:  Cough, congestion EXAM: CHEST  2 VIEW COMPARISON:  None. FINDINGS: The heart size and mediastinal contours are within normal limits. Both lungs are clear. The visualized skeletal structures are unremarkable. IMPRESSION: No active cardiopulmonary disease. Electronically Signed   By: Charlett NoseKevin  Dover M.D.   On: 10/03/2015 09:51   ASSESSMENT/PLAN: Patient will continue Cleocin. Referral made to ENT. He may need tubes. He will continue his current regimen and add Astepro and Flonase.I personally performed the services described in this documentation, which was scribed in my presence. The recorded information has been reviewed and is accurate.   Gross sideeffects, risk and benefits, and alternatives of  medications d/w patient. Patient is aware that all medications have potential sideeffects and we are unable to predict every sideeffect or drug-drug interaction that may occur.  Lesle ChrisSteven Hailei Besser MD 10/03/2015 8:26 AM

## 2015-10-03 NOTE — Telephone Encounter (Signed)
I have called patient to advise him xray reports are normal per Dr Cleta Albertsaub

## 2015-10-03 NOTE — Patient Instructions (Addendum)
   IF you received an x-ray today, you will receive an invoice from [facility]. Please contact [facility] at [phone] with questions or concerns regarding your invoice.   IF you received labwork today, you will receive an invoice from [facility]. Please contact [facility] at [phone] with questions or concerns regarding your invoice.   Our billing staff will not be able to assist you with questions regarding bills from these companies.  You will be contacted with the lab results as soon as they are available. The fastest way to get your results is to activate your My Chart account. Instructions are located on the last page of this paperwork. If you have not heard from us regarding the results in 2 weeks, please contact this office.     Otitis Media With Effusion Otitis media with effusion is the presence of fluid in the middle ear. This is a common problem in children, which often follows ear infections. It may be present for weeks or longer after the infection. Unlike an acute ear infection, otitis media with effusion refers only to fluid behind the ear drum and not infection. Children with repeated ear and sinus infections and allergy problems are the most likely to get otitis media with effusion. CAUSES  The most frequent cause of the fluid buildup is dysfunction of the eustachian tubes. These are the tubes that drain fluid in the ears to the back of the nose (nasopharynx). SYMPTOMS   The main symptom of this condition is hearing loss. As a result, you or your child may:  Listen to the TV at a loud volume.  Not respond to questions.  Ask "what" often when spoken to.  Mistake or confuse one sound or word for another.  There may be a sensation of fullness or pressure but usually not pain. DIAGNOSIS   Your health care provider will diagnose this condition by examining you or your child's ears.  Your health care provider may test  the pressure in you or your child's ear with a tympanometer.  A hearing test may be conducted if the problem persists. TREATMENT   Treatment depends on the duration and the effects of the effusion.  Antibiotics, decongestants, nose drops, and cortisone-type drugs (tablets or nasal spray) may not be helpful.  Children with persistent ear effusions may have delayed language or behavioral problems. Children at risk for developmental delays in hearing, learning, and speech may require referral to a specialist earlier than children not at risk.  You or your child's health care provider may suggest a referral to an ear, nose, and throat surgeon for treatment. The following may help restore normal hearing:  Drainage of fluid.  Placement of ear tubes (tympanostomy tubes).  Removal of adenoids (adenoidectomy). HOME CARE INSTRUCTIONS   Avoid secondhand smoke.  Infants who are breastfed are less likely to have this condition.  Avoid feeding infants while they are lying flat.  Avoid known environmental allergens.  Avoid people who are sick. SEEK MEDICAL CARE IF:   Hearing is not better in 3 months.  Hearing is worse.  Ear pain.  Drainage from the ear.  Dizziness. MAKE SURE YOU:   Understand these instructions.  Will watch your condition.  Will get help right away if you are not doing well or get worse.   This information is not intended to replace advice given to you by your health care provider. Make sure you discuss any questions you have with your health care provider.     Document Released: 05/26/2004 Document Revised: 05/09/2014 Document Reviewed: 11/13/2012 Elsevier Interactive Patient Education [employer].

## 2016-06-23 ENCOUNTER — Emergency Department (INDEPENDENT_AMBULATORY_CARE_PROVIDER_SITE_OTHER)
Admission: EM | Admit: 2016-06-23 | Discharge: 2016-06-23 | Disposition: A | Discharge status: Home / Self Care | Payer: PRIVATE HEALTH INSURANCE | Source: Home / Self Care | Attending: Family Medicine | Admitting: Family Medicine

## 2016-06-23 DIAGNOSIS — J039 Acute tonsillitis, unspecified: Secondary | ICD-10-CM

## 2016-06-23 MED ORDER — AMOXICILLIN 875 MG PO TABS: 875.0000 mg | ORAL_TABLET | Freq: Two times a day (BID) | ORAL | 0 refills | Status: DC

## 2016-06-23 MED ORDER — PREDNISONE 20 MG PO TABS: ORAL_TABLET | ORAL | 0 refills | Status: DC

## 2016-06-23 NOTE — ED Provider Notes (Signed)
[facility]    CSN: [id number] Arrival date & time: 06/23/16  1950     History   Chief Complaint Chief Complaint  Patient presents with  . Sore Throat    HPI Edward Doyle is a 46 y.o. male.   Patient complains of sore throat for two days, becoming more swollen and painful today.  He has had sweats at night.  He has had an occasional cough but no other URI symptoms.   The history is provided by the patient.    Past Medical History:  Diagnosis Date  . Depression   . Environmental and seasonal allergies   . Hyperlipidemia     Patient Active Problem List   Diagnosis Date Noted  . GERD (gastroesophageal reflux disease) 09/12/2015  . Hyperlipidemia 09/12/2015  . Smoker 09/12/2015    Past Surgical History:  Procedure Laterality Date  . LEG SURGERY Right    hip, leg, ankle, shoulder in parachuting accident       Home Medications    Prior to Admission medications   Medication Sig Start Date End Date Taking? Authorizing Provider  amoxicillin (AMOXIL) 875 MG tablet Take 1 tablet (875 mg total) by mouth 2 (two) times daily. 06/23/16   Lattie HawStephen A Beese, MD  azelastine (ASTELIN) 0.1 % nasal spray 2 sprays each nostril twice daily 10/03/15   Collene GobbleSteven A Daub, MD  fluticasone (FLONASE) 50 MCG/ACT nasal spray Place 2 sprays into both nostrils daily. 10/03/15   Collene GobbleSteven A Daub, MD  predniSONE (DELTASONE) 20 MG tablet Take one tab by mouth twice daily for 5 days, then one daily. Take with food. 06/23/16   Lattie HawStephen A Beese, MD    Family History History reviewed. No pertinent family history.  Social History Social History  Substance Use Topics  . Smoking status: Current Every Day Smoker    Packs/day: 1.00    Years: 25.00    Types: Cigarettes  . Smokeless tobacco: Never Used     Comment: thinks about quitting every day  . Alcohol use 0.0 oz/week     Comment: occasionally     Allergies   Patient has no known allergies.   Review of Systems Review of Systems +  sore throat + occasional cough No pleuritic pain No wheezing No nasal congestion ? post-nasal drainage No sinus pain/pressure No itchy/red eyes No earache No hemoptysis No SOB No fever, + chills/sweats No nausea No vomiting No abdominal pain No diarrhea No urinary symptoms No skin rash + fatigue No myalgias No headache Used OTC meds without relief   Physical Exam Triage Vital Signs ED Triage Vitals  Enc Vitals Group     BP 06/23/16 2005 112/79     Pulse Rate 06/23/16 2005 73     Resp --      Temp 06/23/16 2005 98.3 F (36.8 C)     Temp Source 06/23/16 2005 Oral     SpO2 06/23/16 2005 97 %     Weight 06/23/16 2006 220 lb (99.8 kg)     Height 06/23/16 2006 6\' 2"  (1.88 m)     Head Circumference --      Peak Flow --      Pain Score 06/23/16 2007 4     Pain Loc --      Pain Edu? --      Excl. in GC? --    No data found.   Updated Vital Signs BP 112/79 (BP Location: Left Arm)   Pulse 73   Temp 98.3  F (36.8 C) (Oral)   Ht 6\' 2"  (1.88 m)   Wt 220 lb (99.8 kg)   SpO2 97%   BMI 28.25 kg/m   Visual Acuity Right Eye Distance:   Left Eye Distance:   Bilateral Distance:    Right Eye Near:   Left Eye Near:    Bilateral Near:     Physical Exam Nursing notes and Vital Signs reviewed. Appearance:  Patient appears stated age, and in no acute distress Eyes:  Pupils are equal, round, and reactive to light and accomodation.  Extraocular movement is intact.  Conjunctivae are not inflamed  Ears:  Canals normal.  Tympanic membranes normal.  Nose:   Normal turbinates.  No sinus tenderness.    Pharynx:  Pharynx:  Erythematous and slightly swollen without obstruction.  Uvula also erythematous Neck:  Supple.  Tender enlarged tonsillar nodes. Lungs:  Clear to auscultation.  Breath sounds are equal.  Moving air well. Heart:  Regular rate and rhythm without murmurs, rubs, or gallops.  Abdomen:  Nontender without masses or hepatosplenomegaly.  Bowel sounds are present.  No  CVA or flank tenderness.  Extremities:  No edema.  Skin:  No rash present.    UC Treatments / Results  Labs (all labs ordered are listed, but only abnormal results are displayed) Labs Reviewed  POCT RAPID STREP A (OFFICE) negative    EKG  EKG Interpretation None       Radiology No results found.  Procedures Procedures (including critical care time)  Medications Ordered in UC Medications - No data to display   Initial Impression / Assessment and Plan / UC Course  I have reviewed the triage vital signs and the nursing notes.  Pertinent labs & imaging results that were available during my care of the patient were reviewed by me and considered in my medical decision making (see chart for details).    ? False negative rapid strep test. Begin amoxicillin and prednisone burst/taper. Try warm salt water gargles for sore throat.  May take Tylenol as needed for fever, headache, sore throat, etc.  Followup with Family Doctor if not improved in one week.     Final Clinical Impressions(s) / UC Diagnoses   Final diagnoses:  Tonsillitis    New Prescriptions New Prescriptions   AMOXICILLIN (AMOXIL) 875 MG TABLET    Take 1 tablet (875 mg total) by mouth 2 (two) times daily.   PREDNISONE (DELTASONE) 20 MG TABLET    Take one tab by mouth twice daily for 5 days, then one daily. Take with food.     Lattie Haw, MD 07/04/16 [phone]

## 2016-06-23 NOTE — Discharge Instructions (Signed)
Try warm salt water gargles for sore throat.  May take Tylenol as needed for fever, headache, sore throat, etc.  °

## 2016-06-23 NOTE — ED Triage Notes (Signed)
Sore throat started 2 days ago.  Swollen today and more painful.

## 2016-06-24 LAB — POCT RAPID STREP A (OFFICE): RAPID STREP A SCREEN: NEGATIVE

## 2016-07-06 ENCOUNTER — Telehealth: Payer: Self-pay | Admitting: *Deleted

## 2016-07-06 NOTE — Telephone Encounter (Signed)
Encounter created to enter strep test order and result not entered on DOS.   

## 2016-10-03 ENCOUNTER — Ambulatory Visit (INDEPENDENT_AMBULATORY_CARE_PROVIDER_SITE_OTHER): Payer: No Typology Code available for payment source | Admitting: Family Medicine

## 2016-10-03 ENCOUNTER — Encounter: Payer: Self-pay | Admitting: Family Medicine

## 2016-10-03 VITALS — BP 124/76 | HR 74 | Temp 98.7°F | Resp 18 | Ht 74.0 in | Wt 224.8 lb

## 2016-10-03 DIAGNOSIS — L03317 Cellulitis of buttock: Secondary | ICD-10-CM

## 2016-10-03 DIAGNOSIS — W57XXXA Bitten or stung by nonvenomous insect and other nonvenomous arthropods, initial encounter: Secondary | ICD-10-CM | POA: Insufficient documentation

## 2016-10-03 DIAGNOSIS — Z23 Encounter for immunization: Secondary | ICD-10-CM

## 2016-10-03 MED ORDER — DOXYCYCLINE HYCLATE 100 MG PO TABS: 100.0000 mg | ORAL_TABLET | Freq: Two times a day (BID) | ORAL | 0 refills | Status: DC

## 2016-10-03 NOTE — Progress Notes (Signed)
  Edward Doyle - 46 y.o. male MRN [id number]  Date of birth: 02/10/1971  SUBJECTIVE:  Including CC & ROS.  Chief Complaint  Patient presents with  . Mass    mass on left buttocks x3days    Edward Doyle is a 46 yo M that is presenting with a mass on his buttock. This started on Friday. Unsure if he has gotten bit. He is having pain. He works at the [employer]. Denies any recent travel. The pain seems to be getting worse. Has some warmth over the area. Tried to squeeze it. Has gotten bigger since Friday.   ROS: No unexpected weight loss, fever, chills, swelling, instability, muscle pain, numbness/tingling, redness, otherwise see HPI   HISTORY: Past Medical, Surgical, Social, and Family History Reviewed & Updated per EMR.   Pertinent Historical Findings include: PMHx: none Surgical:   Broken pelvis  Social:  Current smoker,  FHx:  HTN  PHYSICAL EXAM:  VS: BP 124/76   Pulse 74   Temp 98.7 F (37.1 C) (Oral)   Resp 18   Ht 6\' 2"  (1.88 m)   Wt 224 lb 12.8 oz (102 kg)   SpO2 96%   BMI 28.86 kg/m  PHYSICAL EXAM: Gen: NAD, alert, cooperative with exam, well-appearing HEENT: NCAT, EOMI, clear conjunctiva,  CV: no edema, capillary refill brisk  Resp:  non-labored Skin: normal turgor, left upper buttock demonstrate a wound that resembles a bite with two puncture marks. There is erythema around the wound. No active draining, TTP of the area.  Neuro: no gross deficits.  Psych:  alert and oriented    ASSESSMENT & PLAN:   Bug bite It appears the wound is caused from a bite. Possible for a spider bite but he doesn't remember seeing anything afterwards.  - wound culture  - doxycycline for surrounding erythema  - provided care instructions  - given indications to return if no improvement in 48 hours.

## 2016-10-03 NOTE — Patient Instructions (Addendum)
Thank you for coming in,   Please follow up with us if your pain is not improving.   Please try to take a pro-biotic while on the antibiotic.    Please feel free to call with any questions or concerns at any time, at [phone]. --Dr. Deberah PeltonSchmitz    Brown Recluse Spider Bite A bite from a brown recluse spider can be serious or life threatening. Brown recluse spiders can be dark brown to light tan in color. They have a band of darker color on their back. If you think you were bitten by a brown recluse spider, get help right away. Follow these instructions at home: Medicines  Take or apply over-the-counter and prescription medicines only as told by your doctor.  If you were prescribed an antibiotic medicine, take or apply it as told by your doctor. Do not stop using the antibiotic even if your condition improves. Wound care  Follow instructions from your doctor about how to take care of your bite wound. Make sure you: ? Wash your hands with soap and water before you change your bandage (dressing). If you cannot use soap and water, use hand sanitizer. ? Change your bandage as told by your doctor.  Keep the bite area clean and dry. Wash the bite area with soap and water as told by your doctor.  Do not scratch the bite area. General instructions  If directed, apply ice to the bite area. ? Put ice in a plastic bag. ? Place a towel between your skin and the bag. ? Leave the ice on for 20 minutes, 2-3 times per day.  Raise (elevate) the bite area above the level of your heart while you are sitting or lying down.  Keep all follow-up visits as told by your doctor. This is important. Contact a doctor if:  Your symptoms do not get better after 24 hours.  Your symptoms get worse.  You have redness, swelling, or pain that gets worse. Get help right away if:  Your bite wound seems to be getting bigger or deeper.  You have fluid, blood, or pus coming from your bite area.  You have  chills or a fever.  You feel sick to your stomach (nauseous).  You throw up (vomit).  You have muscle aches.  You feel very weak.  You feel very tired.  Your body moves on its own (convulsions).  You develop a rash.  You pee (urinate) less often than usual.  You have blood in your pee or other unusual bleeding.  Your skin turns yellow. This information is not intended to replace advice given to you by your health care provider. Make sure you discuss any questions you have with your health care provider. Document Released: 04/07/2011 Document Revised: 09/24/2015 Document Reviewed: 09/03/2014 Elsevier Interactive Patient Education  2018 [employer]   IF you received an x-ray today, you will receive an invoice from [facility]. Please contact [facility] at [phone] with questions or concerns regarding your invoice.   IF you received labwork today, you will receive an invoice from [facility]. Please contact [facility] at [phone] with questions or concerns regarding your invoice.   Our billing staff will not be able to assist you with questions regarding bills from these companies.  You will be contacted with the lab results as soon as they are available. The fastest way to get your results is to activate your My Chart account. Instructions are located on the last page of this paperwork. If you have  not heard from us regarding the results in 2 weeks, please contact this office.      

## 2016-10-03 NOTE — Assessment & Plan Note (Signed)
It appears the wound is caused from a bite. Possible for a spider bite but he doesn't remember seeing anything afterwards.  - wound culture  - doxycycline for surrounding erythema  - provided care instructions  - given indications to return if no improvement in 48 hours.

## 2016-10-05 LAB — WOUND CULTURE: Organism ID, Bacteria: NONE SEEN

## 2016-10-08 ENCOUNTER — Telehealth: Payer: Self-pay | Admitting: Emergency Medicine

## 2016-10-08 NOTE — Telephone Encounter (Signed)
-----   Message from Shade FloodJeffrey R Greene, MD sent at 10/07/2016 12:55 PM EDT ----- Call patient.  Culture did show infection, but appears to be sensitive to antibiotic prescribed.  Finish course of antibiotic and rtc if not improved - sooner if worse.

## 2016-11-11 IMAGING — DX DG CHEST 2V
2 series · 3 of 3 positions shown · non-contrast
Comparison: None.

CLINICAL DATA: Cough, congestion

EXAM:
CHEST  2 VIEW

[Series 1: chest pa · 0.14mm/px · 2 of 2 slices shown]
[im 1/2]
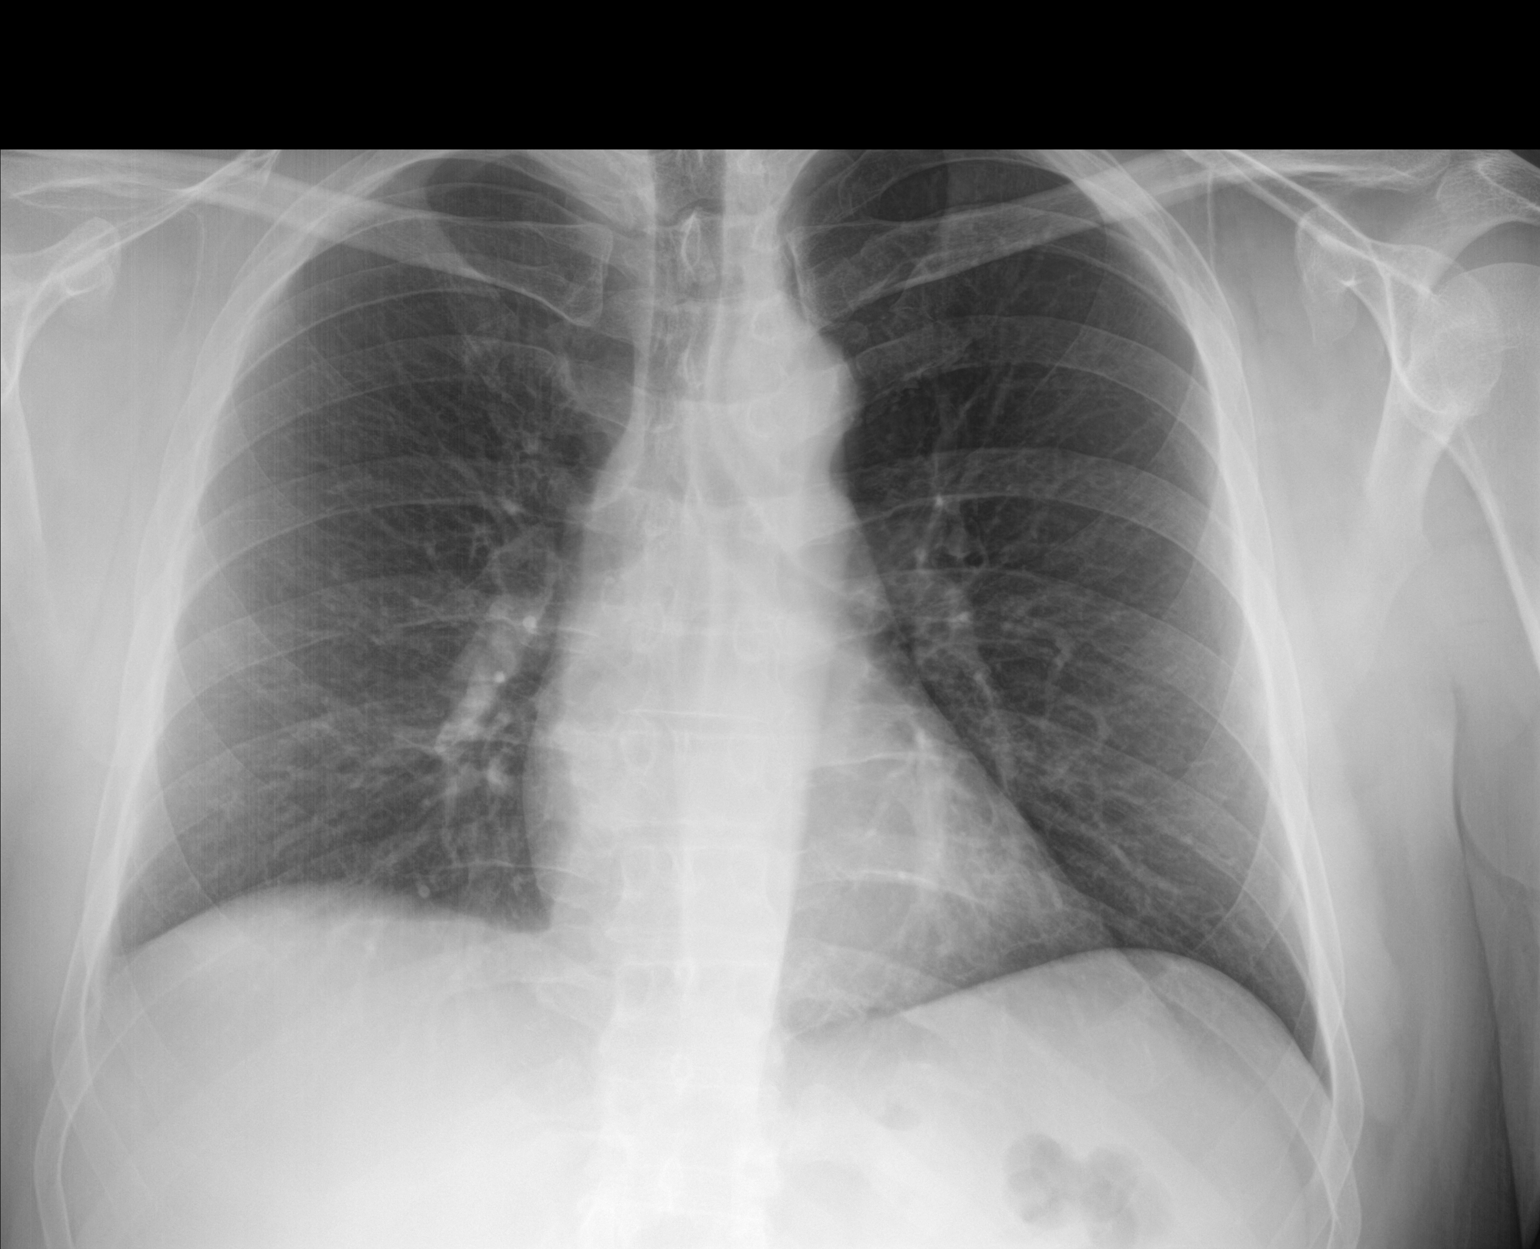
[im 2/2]
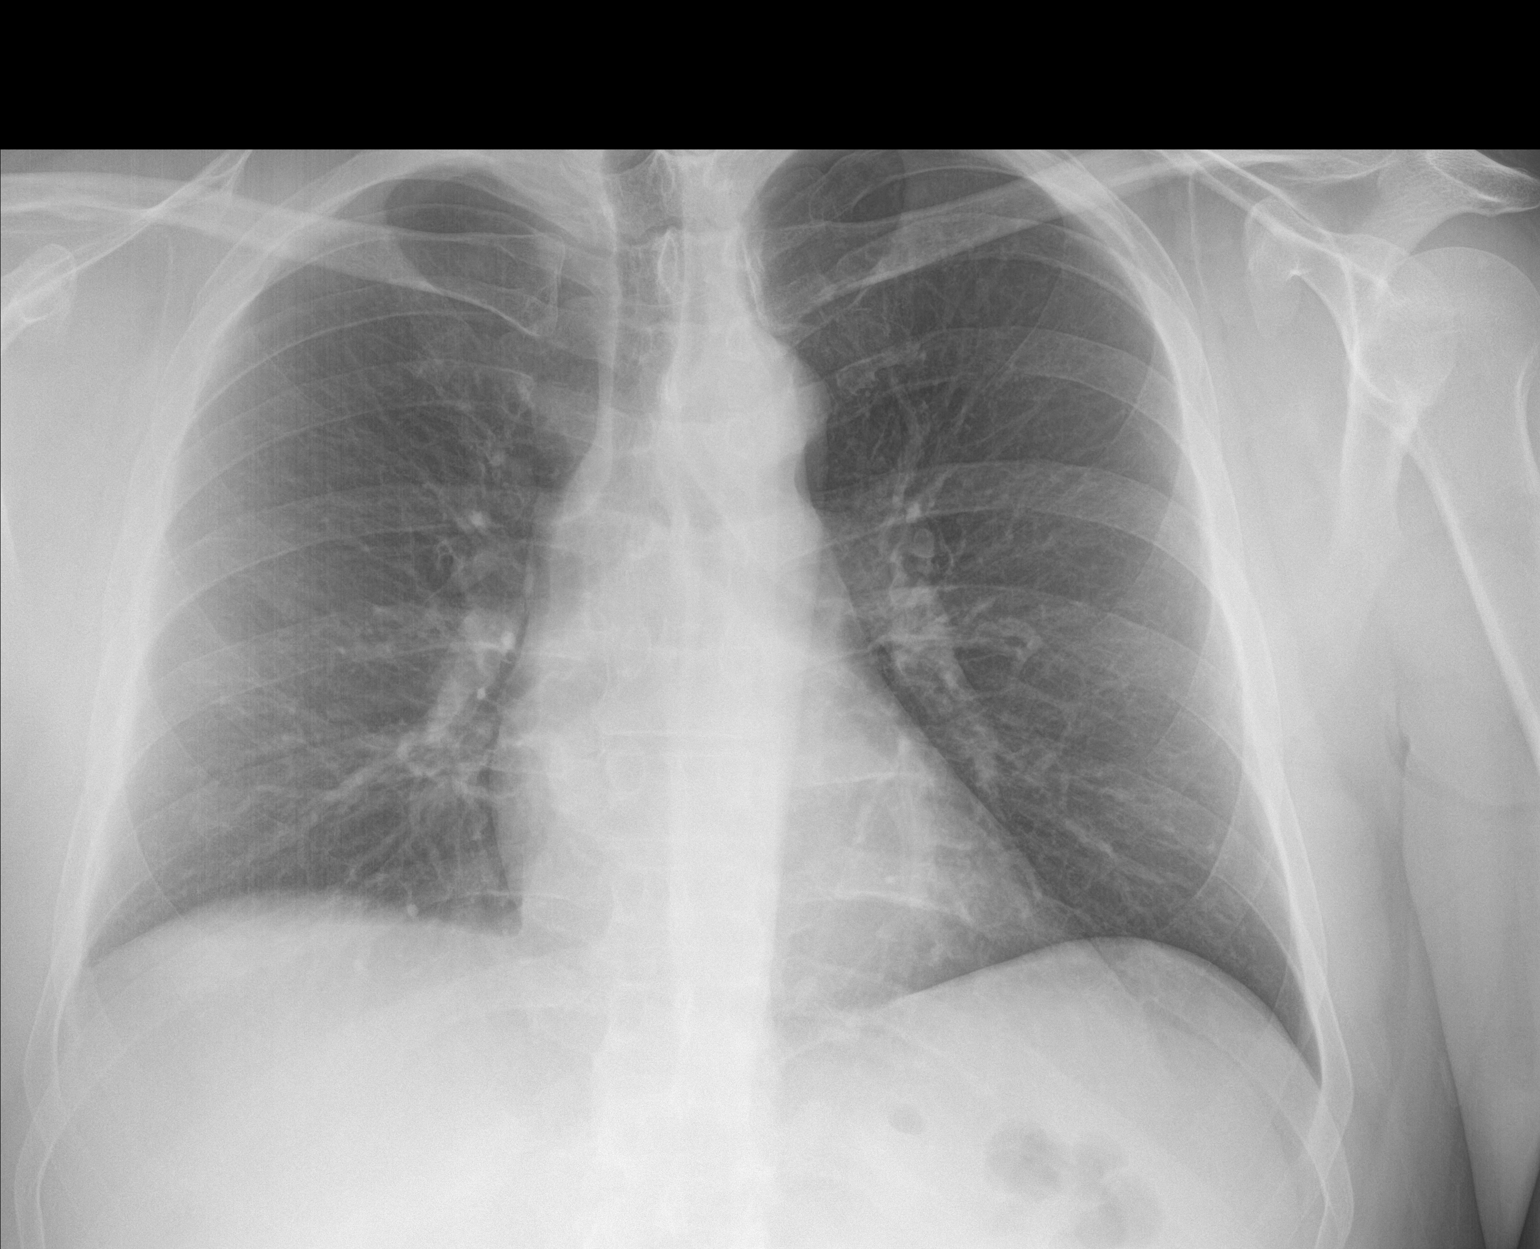

[chest lat]
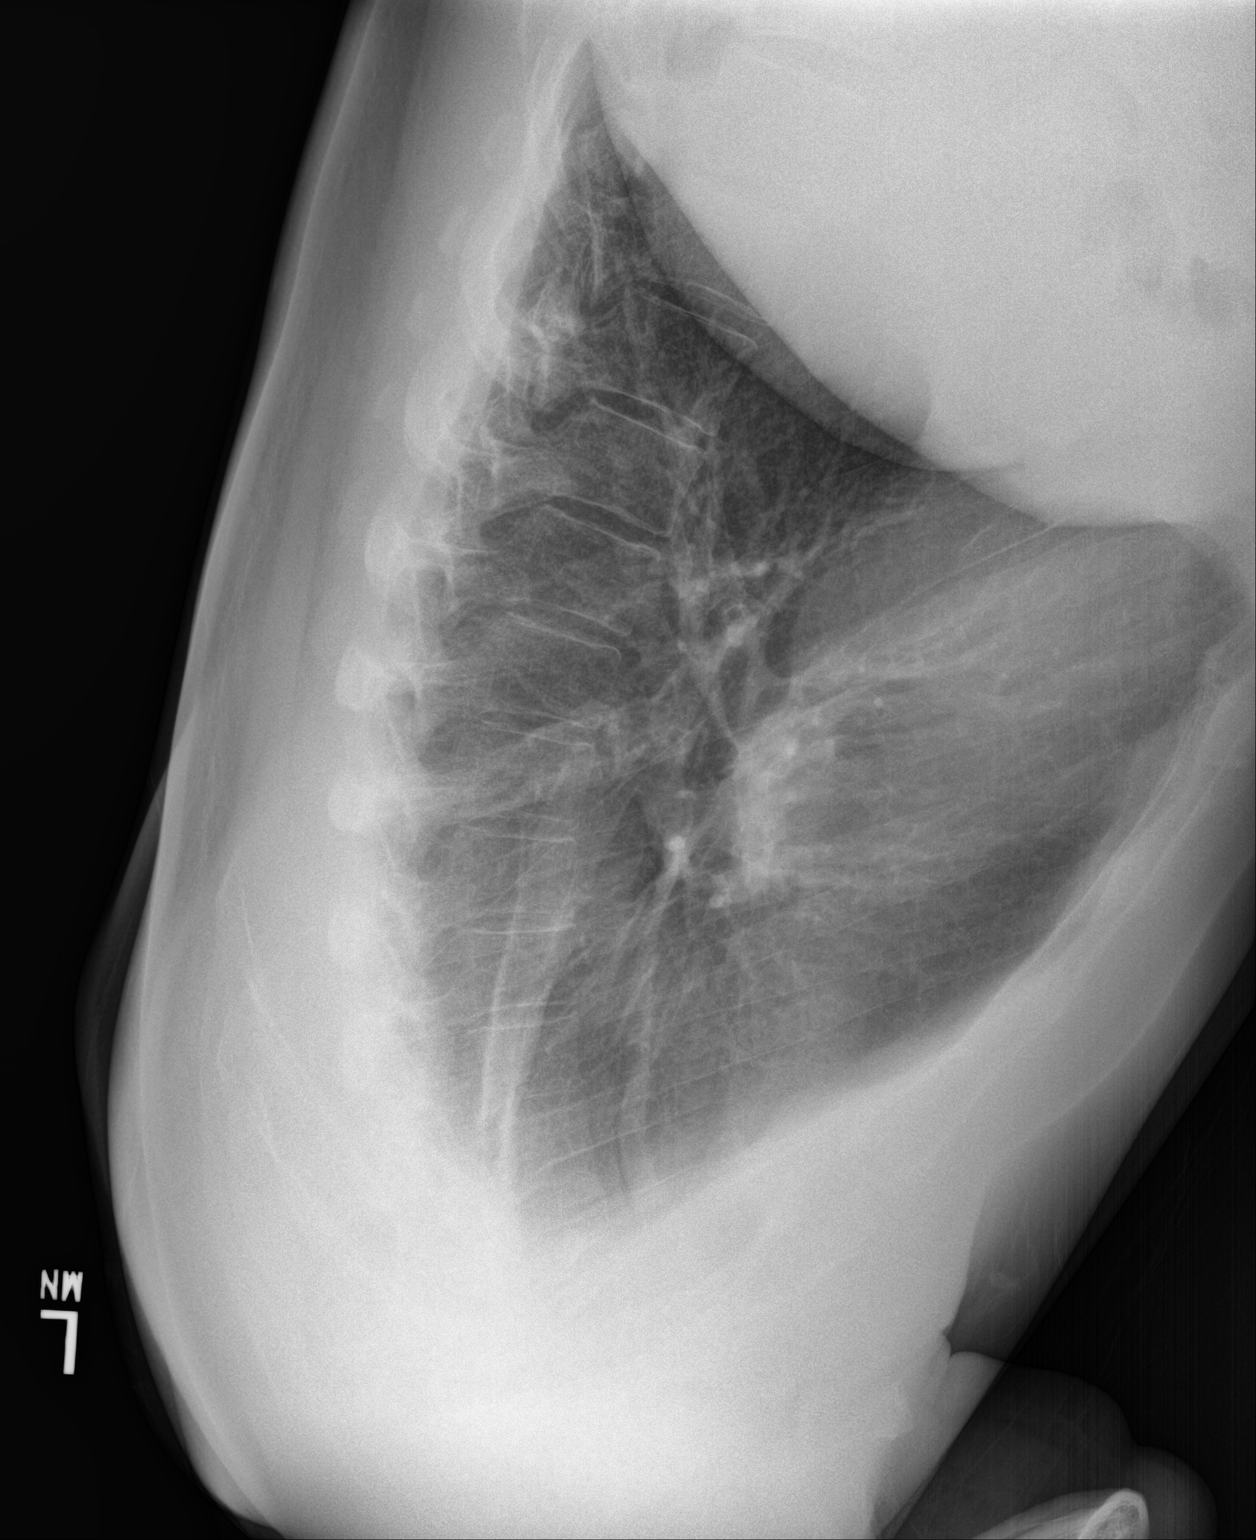

[3 of 3 positions shown; findings below may reference images not displayed]

FINDINGS: The heart size and mediastinal contours are within normal limits.
Both lungs are clear. The visualized skeletal structures are
unremarkable.
IMPRESSION: No active cardiopulmonary disease.

## 2016-11-11 IMAGING — DX DG SINUSES 1-2V
1 series · 1 of 1 positions shown · non-contrast
Comparison: None.

CLINICAL DATA: Cough, congestion

EXAM:
PARANASAL SINUSES - 1-2 VIEW

[skull lat]
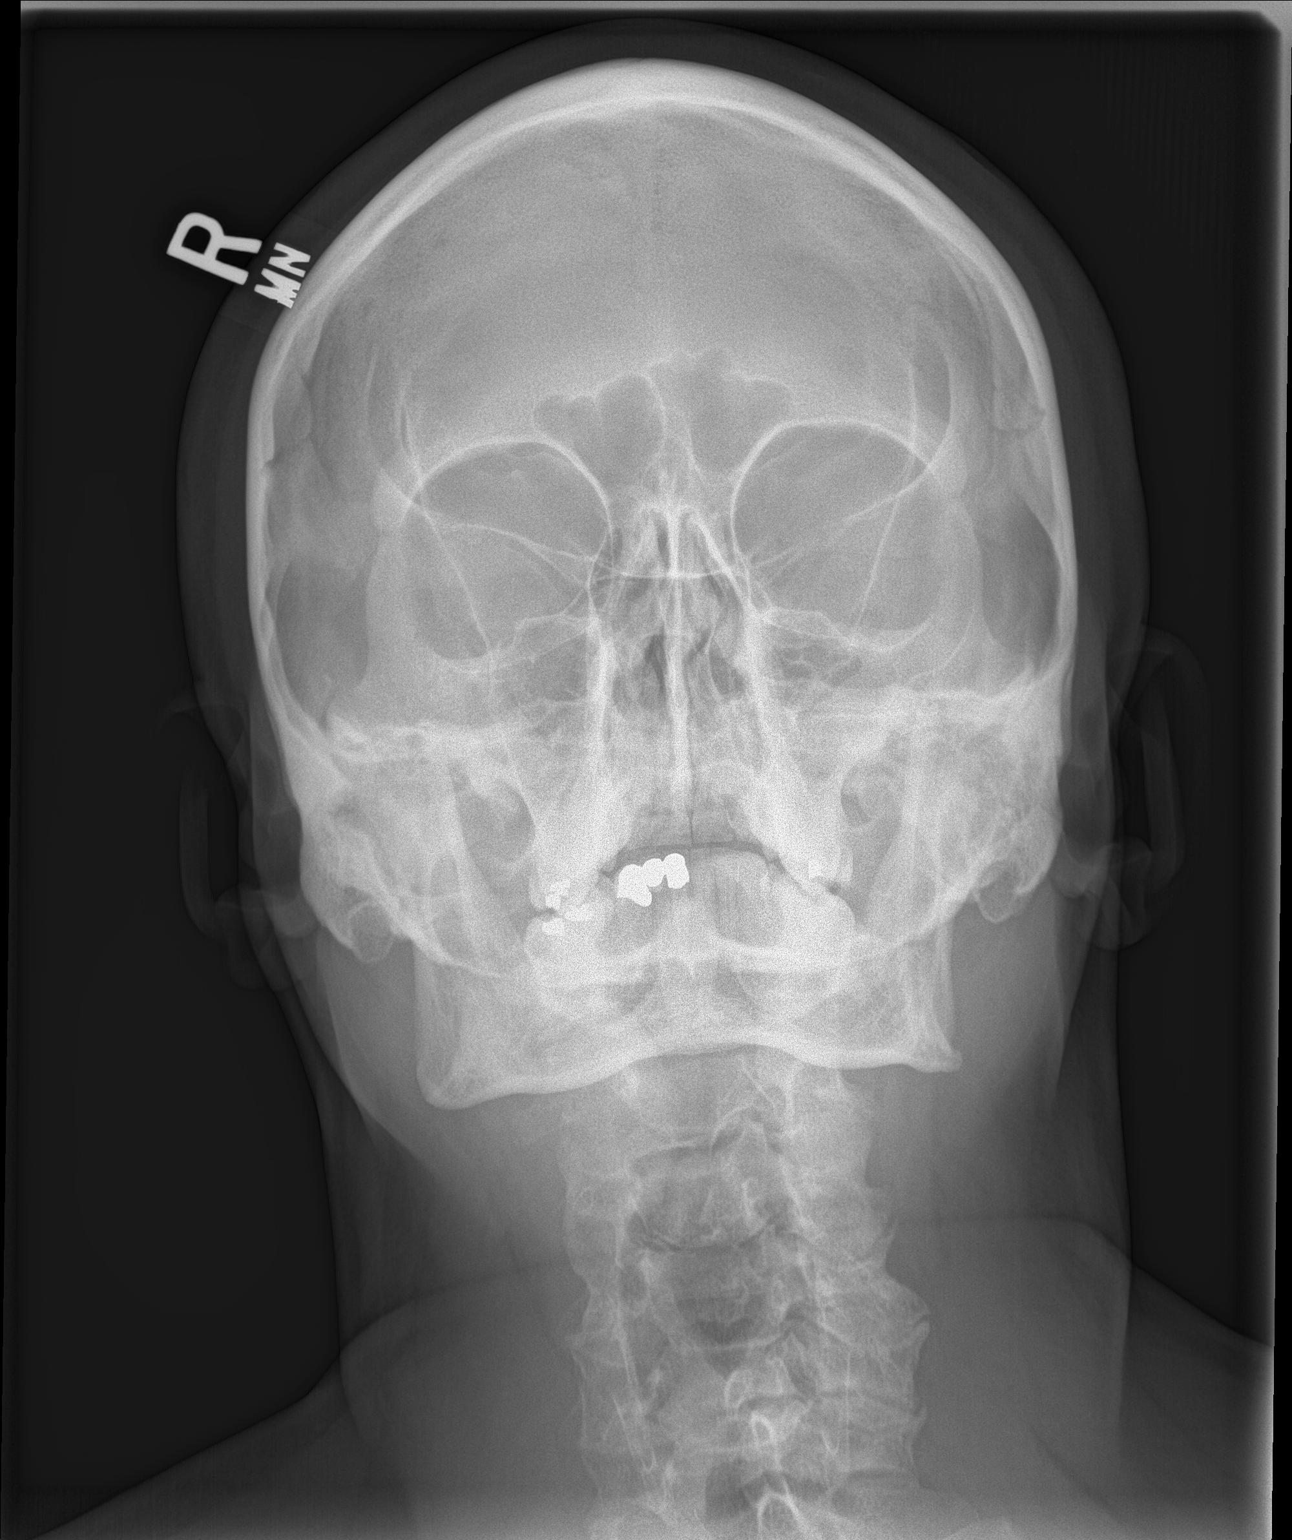

[1 of 1 positions shown; findings below may reference images not displayed]

FINDINGS: The paranasal sinus are aerated. There is no evidence of sinus
opacification air-fluid levels or mucosal thickening. No significant
bone abnormalities are seen.
IMPRESSION: Negative.

## 2021-02-05 ENCOUNTER — Emergency Department (INDEPENDENT_AMBULATORY_CARE_PROVIDER_SITE_OTHER): Payer: PRIVATE HEALTH INSURANCE

## 2021-02-05 ENCOUNTER — Emergency Department (INDEPENDENT_AMBULATORY_CARE_PROVIDER_SITE_OTHER)
Admission: EM | Admit: 2021-02-05 | Discharge: 2021-02-05 | Disposition: A | Discharge status: Home / Self Care | Payer: PRIVATE HEALTH INSURANCE | Source: Home / Self Care

## 2021-02-05 ENCOUNTER — Other Ambulatory Visit: Payer: Self-pay

## 2021-02-05 ENCOUNTER — Encounter: Payer: Self-pay | Admitting: Emergency Medicine

## 2021-02-05 DIAGNOSIS — M79672 Pain in left foot: Secondary | ICD-10-CM | POA: Diagnosis not present

## 2021-02-05 DIAGNOSIS — M25572 Pain in left ankle and joints of left foot: Secondary | ICD-10-CM

## 2021-02-05 NOTE — ED Triage Notes (Signed)
Pt c/o left foot pain that started suddenly today. Denies known injury. Took ibuprofen with no relief.

## 2021-02-05 NOTE — ED Provider Notes (Signed)
[name]    CSN: [id number] Arrival date & time: 02/05/21  1739      History   Chief Complaint No chief complaint on file.   HPI Abdulai Blaylock is a 50 y.o. male.   HPI 50 year old male presents with left foot pain that began earlier today.  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  Patient reports proceeding to work, once they are walking around left foot pain became worse and now has reported here for evaluation.  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 Authorizing Provider  amoxicillin (AMOXIL) 875 MG tablet Take 1 tablet (875 mg total) by mouth 2 (two) times daily. Patient not taking: No sig reported 06/23/16   Lattie Haw, MD  azelastine (ASTELIN) 0.1 % nasal spray 2 sprays each nostril twice daily Patient not taking: No sig reported 10/03/15   Collene Gobble, MD  doxycycline (VIBRA-TABS) 100 MG tablet Take 1 tablet (100 mg total) by mouth 2 (two) times daily. 10/03/16   Myra Rude, MD  fluticasone (FLONASE) 50 MCG/ACT nasal spray Place 2 sprays into both nostrils daily. Patient not taking: No sig reported 10/03/15   Collene Gobble, MD  predniSONE (DELTASONE) 20 MG tablet Take one tab by mouth twice daily for 5 days, then one daily. Take with food. Patient not taking: No sig reported 06/23/16   Lattie Haw, MD    Family  History History reviewed. No pertinent family history.  Social History Social History   Tobacco Use   Smoking status: Every Day    Packs/day: 1.00    Years: 25.00    Pack years: 25.00    Types: Cigarettes   Smokeless tobacco: Never   Tobacco comments:    thinks about quitting every day  Substance Use Topics   Alcohol use: Yes    Alcohol/week: 0.0 standard drinks    Comment: occasionally   Drug use: No     Allergies   Patient has no known allergies.   Review of Systems Review of Systems  Musculoskeletal:        Left foot pain x 1 day    Physical Exam Triage Vital Signs ED Triage Vitals [02/05/21 1752]  Enc Vitals Group     BP (!) 145/87     Pulse Rate 74     Resp      Temp 98.4 F (36.9 C)     Temp Source Oral     SpO2 98 %     Weight      Height      Head Circumference      Peak Flow      Pain Score 9     Pain Loc      Pain Edu?      Excl. in GC?  No data found.  Updated Vital Signs BP (!) 145/87 (BP Location: Right Arm)   Pulse 74   Temp 98.4 F (36.9 C) (Oral)   SpO2 98%   Physical Exam Vitals and nursing note reviewed.  Constitutional:      General: He is not in acute distress.    Appearance: Normal appearance. He is normal weight. He is not ill-appearing.  HENT:     Head: Normocephalic and atraumatic.     Mouth/Throat:     Mouth: Mucous membranes are moist.     Pharynx: Oropharynx is clear.  Eyes:     Extraocular Movements: Extraocular movements intact.     Conjunctiva/sclera: Conjunctivae normal.     Pupils: Pupils are equal, round, and reactive to light.  Cardiovascular:     Rate and Rhythm: Normal rate and regular rhythm.  Pulmonary:     Effort: Pulmonary effort is normal.     Breath sounds: Normal breath sounds.  Musculoskeletal:        General: Normal range of motion.     Cervical back: Normal range of motion and neck supple.     Comments: Left foot (dorsal lateral aspect): TTP over fourth and fifth metatarsal head,  cuboid, lateral cuneiform, with mild soft tissue swelling noted, patient reports increased pain with weightbearing/walking.  Skin:    General: Skin is warm and dry.  Neurological:     General: No focal deficit present.     Mental Status: He is alert and oriented to person, place, and time. Mental status is at baseline.  Psychiatric:        Mood and Affect: Mood normal.        Behavior: Behavior normal.        Thought Content: Thought content normal.     UC Treatments / Results  Labs (all labs ordered are listed, but only abnormal results are displayed) Labs Reviewed - No data to display  EKG   Radiology DG Foot Complete Left  Result Date: 02/05/2021 CLINICAL DATA:  Fourth and fifth metatarsal pain for 1 day, altered gait EXAM: LEFT FOOT - COMPLETE 3+ VIEW COMPARISON:  None. FINDINGS: Frontal, oblique, and lateral views of the left foot are obtained. No acute fracture, subluxation, or dislocation. Joint spaces are well preserved. On the lateral view, there appears to be bony bridging between the anterior process of the calcaneus and the cuboid, compatible with congenital coalition. Soft tissues are unremarkable. IMPRESSION: 1. No acute bony abnormality. 2. 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 Electronically Signed   By: Sharlet Salina M.D.   On: 02/05/2021 18:23    Procedures Procedures (including critical care time)  Medications Ordered in UC Medications - No data to display  Initial Impression / Assessment and Plan / UC Course  I have reviewed the triage vital signs and the nursing notes.  Pertinent labs & imaging results that were available during my care of the patient were reviewed by me and considered in my medical decision making (see chart for details).     MDM: 1.  Left foot pain-left foot x-ray reveals (above) Advised patient may use OTC Ibuprofen 600-800 mg 1-2 times daily, as needed for left foot pain.  Advised patient if left foot  pain continues and/or unresolved please follow-up with PCP for either advanced imaging of left foot, possible podiatry consult, or orthopedic consult for further evaluation.  Advised patient we have provided [facility] orthopedic provider (contact information provided above) should left foot pain  worsen and/or unresolved.  Patient discharged home, hemodynamically stable. Final Clinical Impressions(s) / UC Diagnoses   Final diagnoses:  Left foot pain     Discharge Instructions      Advised patient may use OTC Ibuprofen 600-800 mg 1-2 times daily, as needed for left foot pain.  Advised patient if left foot pain continues and/or unresolved please follow-up with PCP for either advanced imaging of left foot, possible podiatry consult, or orthopedic consult for further evaluation.  Advised patient we have provided [facility] orthopedic provider (contact information provided above) should left foot pain worsen and/or unresolved.     ED Prescriptions   None    PDMP not reviewed this encounter.   Trevor Iha, FNP 02/05/21 1851

## 2021-02-05 NOTE — Discharge Instructions (Addendum)
Advised patient may use OTC Ibuprofen 600-800 mg 1-2 times daily, as needed for left foot pain.  Advised patient if left foot pain continues and/or unresolved please follow-up with PCP for either advanced imaging of left foot, possible podiatry consult, or orthopedic consult for further evaluation.  Advised patient we have provided [facility] orthopedic provider (contact information provided above) should left foot pain worsen and/or unresolved.

## 2021-10-05 ENCOUNTER — Emergency Department (INDEPENDENT_AMBULATORY_CARE_PROVIDER_SITE_OTHER)
Admission: EM | Admit: 2021-10-05 | Discharge: 2021-10-05 | Disposition: A | Discharge status: Home / Self Care | Payer: No Typology Code available for payment source | Source: Home / Self Care

## 2021-10-05 DIAGNOSIS — J039 Acute tonsillitis, unspecified: Secondary | ICD-10-CM | POA: Diagnosis not present

## 2021-10-05 LAB — POCT RAPID STREP A (OFFICE): Rapid Strep A Screen: NEGATIVE

## 2021-10-05 MED ORDER — AMOXICILLIN-POT CLAVULANATE 875-125 MG PO TABS: 1.0000 | ORAL_TABLET | Freq: Two times a day (BID) | ORAL | 0 refills | Status: AC

## 2021-10-05 NOTE — Discharge Instructions (Addendum)
Your rapid strep was negative for strep throat.  A covid swab was recommended. You can complete this at home if desired. We will start treatment for tonsillitis. Please start taking antibiotics as prescribed. Do not stop taking them until all has been completed. After completing the third day of antibiotics, throw away your current toothbrush and get a new one. This will prevent re-contamination. You may alternate ibuprofen and tylenol every 4 hours for fever and pain control. Do not share food, beverages, or kiss on the lips until >48 hours after starting antibiotics and >24 hours after fever resolved.  If symptoms persist or change, return for a re-evaluation.

## 2021-10-05 NOTE — ED Provider Notes (Signed)
[facility]    CSN: [id number] Arrival date & time: 10/05/21  0849      History   Chief Complaint Chief Complaint  Patient presents with   Sore Throat    HPI Edward Doyle is a 51 y.o. male.   51yo male presents with a 3 day onset of sore throat and body aches. Denies known fever. Was at the beach this weekend, sx started Sunday night after returning home. No sick contacts, pt is only one with sx. Took OTC tylenol. Reports hx of tonsillitis in the past. Admits myalgias are improved today, not completely resolved. Minimal cough, no ear pain, headache, sinus pain or rash. No GI sx. Did not do home covid testing.   Sore Throat   Past Medical History:  Diagnosis Date   Depression    Environmental and seasonal allergies    Hyperlipidemia     Patient Active Problem List   Diagnosis Date Noted   Bug bite 10/03/2016   GERD (gastroesophageal reflux disease) 09/12/2015   Hyperlipidemia 09/12/2015   Smoker 09/12/2015    Past Surgical History:  Procedure Laterality Date   LEG SURGERY Right    hip, leg, ankle, shoulder in parachuting accident       Home Medications    Prior to Admission medications   Medication Sig Start Date End Date Taking? Authorizing Provider  amoxicillin-clavulanate (AUGMENTIN) 875-125 MG tablet Take 1 tablet by mouth every 12 (twelve) hours for 10 days. 10/05/21 10/15/21 Yes Khalifa Knecht, Sherren Kerns, PA    Family History History reviewed. No pertinent family history.  Social History Social History   Tobacco Use   Smoking status: Every Day    Packs/day: 1.00    Years: 25.00    Pack years: 25.00    Types: Cigarettes   Smokeless tobacco: Never   Tobacco comments:    thinks about quitting every day  Substance Use Topics   Alcohol use: Yes    Alcohol/week: 0.0 standard drinks    Comment: occasionally   Drug use: No     Allergies   Patient has no known allergies.   Review of Systems Review of Systems  HENT:  Positive for sore  throat.   Musculoskeletal:  Positive for myalgias.    Physical Exam Triage Vital Signs ED Triage Vitals  Enc Vitals Group     BP 10/05/21 0908 127/87     Pulse Rate 10/05/21 0908 78     Resp 10/05/21 0908 17     Temp 10/05/21 0908 98.6 F (37 C)     Temp Source 10/05/21 0908 Oral     SpO2 10/05/21 0908 96 %     Weight --      Height --      Head Circumference --      Peak Flow --      Pain Score 10/05/21 0910 5     Pain Loc --      Pain Edu? --      Excl. in [employer]? --    No data found.  Updated Vital Signs BP 127/87 (BP Location: Right Arm)   Pulse 78   Temp 98.6 F (37 C) (Oral)   Resp 17   SpO2 96%   Visual Acuity Right Eye Distance:   Left Eye Distance:   Bilateral Distance:    Right Eye Near:   Left Eye Near:    Bilateral Near:     Physical Exam Vitals and nursing note reviewed.  Constitutional:  General: He is not in acute distress.    Appearance: He is well-developed and normal weight. He is not ill-appearing or toxic-appearing.  HENT:     Head: Normocephalic and atraumatic.     Right Ear: Tympanic membrane and ear canal normal. No drainage, swelling or tenderness. No middle ear effusion. Tympanic membrane is not erythematous.     Left Ear: Tympanic membrane and ear canal normal. No drainage, swelling or tenderness.  No middle ear effusion. Tympanic membrane is not erythematous.     Nose: No congestion or rhinorrhea.     Mouth/Throat:     Mouth: Mucous membranes are moist. No oral lesions.     Pharynx: Uvula midline. Oropharyngeal exudate and posterior oropharyngeal erythema present. No pharyngeal swelling or uvula swelling.     Tonsils: Tonsillar exudate present. No tonsillar abscesses.     Comments: Bilateral tonsils and adenoids appear erythematous, swollen and inflamed. Exudates noted to bilateral tonsils Eyes:     Conjunctiva/sclera: Conjunctivae normal.     Pupils: Pupils are equal, round, and reactive to light.  Neck:     Thyroid: No  thyromegaly.  Cardiovascular:     Rate and Rhythm: Normal rate and regular rhythm.     Heart sounds: No murmur heard.   No gallop.  Pulmonary:     Effort: Pulmonary effort is normal. No respiratory distress.     Breath sounds: Normal breath sounds. No stridor. No wheezing, rhonchi or rales.  Chest:     Chest wall: No tenderness.  Abdominal:     General: Bowel sounds are normal.     Palpations: Abdomen is soft.  Musculoskeletal:     Cervical back: Normal range of motion and neck supple.  Lymphadenopathy:     Cervical: No cervical adenopathy (bilateral submandibular).  Skin:    General: Skin is warm.     Findings: No erythema or rash.  Neurological:     General: No focal deficit present.     Mental Status: He is alert and oriented to person, place, and time.  Psychiatric:        Mood and Affect: Mood normal.        Behavior: Behavior normal.     UC Treatments / Results  Labs (all labs ordered are listed, but only abnormal results are displayed) Labs Reviewed  CULTURE, GROUP A STREP  POCT RAPID STREP A (OFFICE)    EKG   Radiology No results found.  Procedures Procedures (including critical care time)  Medications Ordered in UC Medications - No data to display  Initial Impression / Assessment and Plan / UC Course  I have reviewed the triage vital signs and the nursing notes.  Pertinent labs & imaging results that were available during my care of the patient were reviewed by me and considered in my medical decision making (see chart for details).     Acute tonsillitis - rapid strep negative, however given hx and appearance of throat, suspect bacterial cause. Start augmentin BID x 10 days. Consider home covid testing. Pt refused covid test in office.  Final Clinical Impressions(s) / UC Diagnoses   Final diagnoses:  Acute tonsillitis, unspecified etiology     Discharge Instructions      Your rapid strep was negative for strep throat.  A covid swab was  recommended. You can complete this at home if desired. We will start treatment for tonsillitis. Please start taking antibiotics as prescribed. Do not stop taking them until all has been completed. After completing the  third day of antibiotics, throw away your current toothbrush and get a new one. This will prevent re-contamination. You may alternate ibuprofen and tylenol every 4 hours for fever and pain control. Do not share food, beverages, or kiss on the lips until >48 hours after starting antibiotics and >24 hours after fever resolved.  If symptoms persist or change, return for a re-evaluation.     ED Prescriptions     Medication Sig Dispense Auth. Provider   amoxicillin-clavulanate (AUGMENTIN) 875-125 MG tablet Take 1 tablet by mouth every 12 (twelve) hours for 10 days. 20 tablet Akire Rennert L, [place]      PDMP not reviewed this encounter.   Chaney Malling, [place] 10/05/21 1439

## 2021-10-05 NOTE — ED Triage Notes (Signed)
Pt c/o sore throat since Sunday evening. States he also feels a little achy and tightness in chest with slight cough. Ibuprofen this am.

## 2021-10-07 ENCOUNTER — Telehealth: Payer: Self-pay

## 2021-10-07 NOTE — Telephone Encounter (Signed)
Pt called requesting throat culture results from recent visit. Pt identifiers verified and pt notified that test was still in process. Pt instructed to keep checking mychart and if results return positive he will receive a call. Pt verbalized understanding.

## 2021-10-08 LAB — CULTURE, GROUP A STREP

## 2022-01-20 ENCOUNTER — Ambulatory Visit
Admission: EM | Admit: 2022-01-20 | Discharge: 2022-01-20 | Disposition: A | Discharge status: Home / Self Care | Payer: PRIVATE HEALTH INSURANCE | Attending: Family Medicine | Admitting: Family Medicine

## 2022-01-20 ENCOUNTER — Encounter: Payer: Self-pay | Admitting: Emergency Medicine

## 2022-01-20 ENCOUNTER — Other Ambulatory Visit: Payer: Self-pay

## 2022-01-20 DIAGNOSIS — U071 COVID-19: Secondary | ICD-10-CM | POA: Diagnosis not present

## 2022-01-20 DIAGNOSIS — R509 Fever, unspecified: Secondary | ICD-10-CM

## 2022-01-20 LAB — POCT INFLUENZA A/B
Influenza A, POC: NEGATIVE
Influenza B, POC: NEGATIVE

## 2022-01-20 LAB — POC SARS CORONAVIRUS 2 AG -  ED: SARS Coronavirus 2 Ag: POSITIVE — AB

## 2022-01-20 MED ORDER — PAXLOVID (300/100) 20 X 150 MG & 10 X 100MG PO TBPK: ORAL_TABLET | ORAL | 0 refills | Status: DC

## 2022-01-20 NOTE — ED Provider Notes (Signed)
[facility]    CSN: [id number] Arrival date & time: 01/20/22  1828      History   Chief Complaint Chief Complaint  Patient presents with   Fever    HPI Edward Doyle is a 51 y.o. male.   HPI Healthy 51 year old here for respiratory illness.  Has nasal congestion, fever, runny nose, and cough for the last 3 days.  Also sore throat.  He has had some sweats and chills.  Fatigue and aches. No known exposure to COVID or flu, but he was at a music festival over the weekend and there were a lot of people there.  He did not wear a mask. Tobacco dependence He is COVID vaccinated  Past Medical History:  Diagnosis Date   Depression    Environmental and seasonal allergies    Hyperlipidemia     Patient Active Problem List   Diagnosis Date Noted   Bug bite 10/03/2016   GERD (gastroesophageal reflux disease) 09/12/2015   Hyperlipidemia 09/12/2015   Smoker 09/12/2015    Past Surgical History:  Procedure Laterality Date   LEG SURGERY Right    hip, leg, ankle, shoulder in parachuting accident       Home Medications    Prior to Admission medications   Medication Sig Start Date End Date Taking? Authorizing Provider  nirmatrelvir & ritonavir (PAXLOVID, 300/100,) 20 x 150 MG & 10 x 100MG  TBPK Take as directed 01/20/22  Yes 01/22/22, MD    Family History History reviewed. No pertinent family history.  Social History Social History   Tobacco Use   Smoking status: Every Day    Packs/day: 1.00    Years: 25.00    Total pack years: 25.00    Types: Cigarettes   Smokeless tobacco: Never   Tobacco comments:    thinks about quitting every day  Vaping Use   Vaping Use: Never used  Substance Use Topics   Alcohol use: Yes    Alcohol/week: 0.0 standard drinks of alcohol    Comment: occasionally   Drug use: No     Allergies   Patient has no known allergies.   Review of Systems Review of Systems  See HPI Physical Exam Triage Vital Signs ED  Triage Vitals  Enc Vitals Group     BP 01/20/22 1847 128/88     Pulse Rate 01/20/22 1847 91     Resp 01/20/22 1847 18     Temp 01/20/22 1847 99 F (37.2 C)     Temp Source 01/20/22 1847 Oral     SpO2 01/20/22 1847 97 %     Weight 01/20/22 1848 240 lb (108.9 kg)     Height 01/20/22 1848 6\' 1"  (1.854 m)     Head Circumference --      Peak Flow --      Pain Score 01/20/22 1848 2     Pain Loc --      Pain Edu? --      Excl. in [place]? --    No data found.  Updated Vital Signs BP 128/88 (BP Location: Left Arm)   Pulse 91   Temp 99 F (37.2 C) (Oral)   Resp 18   Ht 6\' 1"  (1.854 m)   Wt 108.9 kg   SpO2 97%   BMI 31.66 kg/m     Physical Exam Constitutional:      General: He is not in acute distress.    Appearance: He is well-developed.  HENT:  Head: Normocephalic and atraumatic.     Right Ear: Tympanic membrane and ear canal normal.     Left Ear: Tympanic membrane and ear canal normal.     Nose: Nose normal.     Mouth/Throat:     Pharynx: Posterior oropharyngeal erythema present.  Eyes:     Conjunctiva/sclera: Conjunctivae normal.     Pupils: Pupils are equal, round, and reactive to light.  Cardiovascular:     Rate and Rhythm: Normal rate and regular rhythm.     Heart sounds: Normal heart sounds.  Pulmonary:     Effort: Pulmonary effort is normal. No respiratory distress.     Breath sounds: No wheezing or rhonchi.  Abdominal:     General: There is no distension.     Palpations: Abdomen is soft.  Musculoskeletal:        General: Normal range of motion.     Cervical back: Normal range of motion.  Skin:    General: Skin is warm and dry.  Neurological:     Mental Status: He is alert.      UC Treatments / Results  Labs (all labs ordered are listed, but only abnormal results are displayed) Labs Reviewed  POC SARS CORONAVIRUS 2 AG -  ED - Abnormal; Notable for the following components:      Result Value   SARS Coronavirus 2 Ag Positive (*)    All other  components within normal limits  POCT INFLUENZA A/B    EKG   Radiology No results found.  Procedures Procedures (including critical care time)  Medications Ordered in UC Medications - No data to display  Initial Impression / Assessment and Plan / UC Course  I have reviewed the triage vital signs and the nursing notes.  Pertinent labs & imaging results that were available during my care of the patient were reviewed by me and considered in my medical decision making (see chart for details).     Discussed COVID.  Home care.  Isolation and quarantine.  Reviewed pros and cons of Paxlovid.  Patient chooses to take medication. Final Clinical Impressions(s) / UC Diagnoses   Final diagnoses:  Fever, unspecified  COVID-19     Discharge Instructions      Drink lots of fluids May take Tylenol or ibuprofen for any pain and fever May use over-the-counter cough and cold medicines as needed Quarantine for 5 days and then wear a mask for 10 full days since onset of symptoms Call for problems     ED Prescriptions     Medication Sig Dispense Auth. Provider   nirmatrelvir & ritonavir (PAXLOVID, 300/100,) 20 x 150 MG & 10 x 100MG  TBPK Take as directed 1 each Meda Edward Jennette Banker, MD      PDMP not reviewed this encounter.   Edward Everts, MD 01/20/22 Edward Doyle

## 2022-01-20 NOTE — ED Triage Notes (Signed)
Congestion, fever, runny nose, sweats, cough, fatigue, sore throat x 3 days

## 2022-01-20 NOTE — Discharge Instructions (Signed)
Drink lots of fluids May take Tylenol or ibuprofen for any pain and fever May use over-the-counter cough and cold medicines as needed Quarantine for 5 days and then wear a mask for 10 full days since onset of symptoms Call for problems

## 2022-01-21 ENCOUNTER — Telehealth: Payer: Self-pay | Admitting: Emergency Medicine

## 2022-01-21 NOTE — Telephone Encounter (Signed)
LMTRC.  Advised if doing well to disregard the call, any questions or concerns feel free to give the office a call back. 

## 2022-03-17 IMAGING — DX DG FOOT COMPLETE 3+V*L*
3 series · 3 of 3 positions shown · non-contrast
Comparison: None.

CLINICAL DATA: Fourth and fifth metatarsal pain for 1 day, altered
gait

EXAM:
LEFT FOOT - COMPLETE 3+ VIEW

[foot ap]
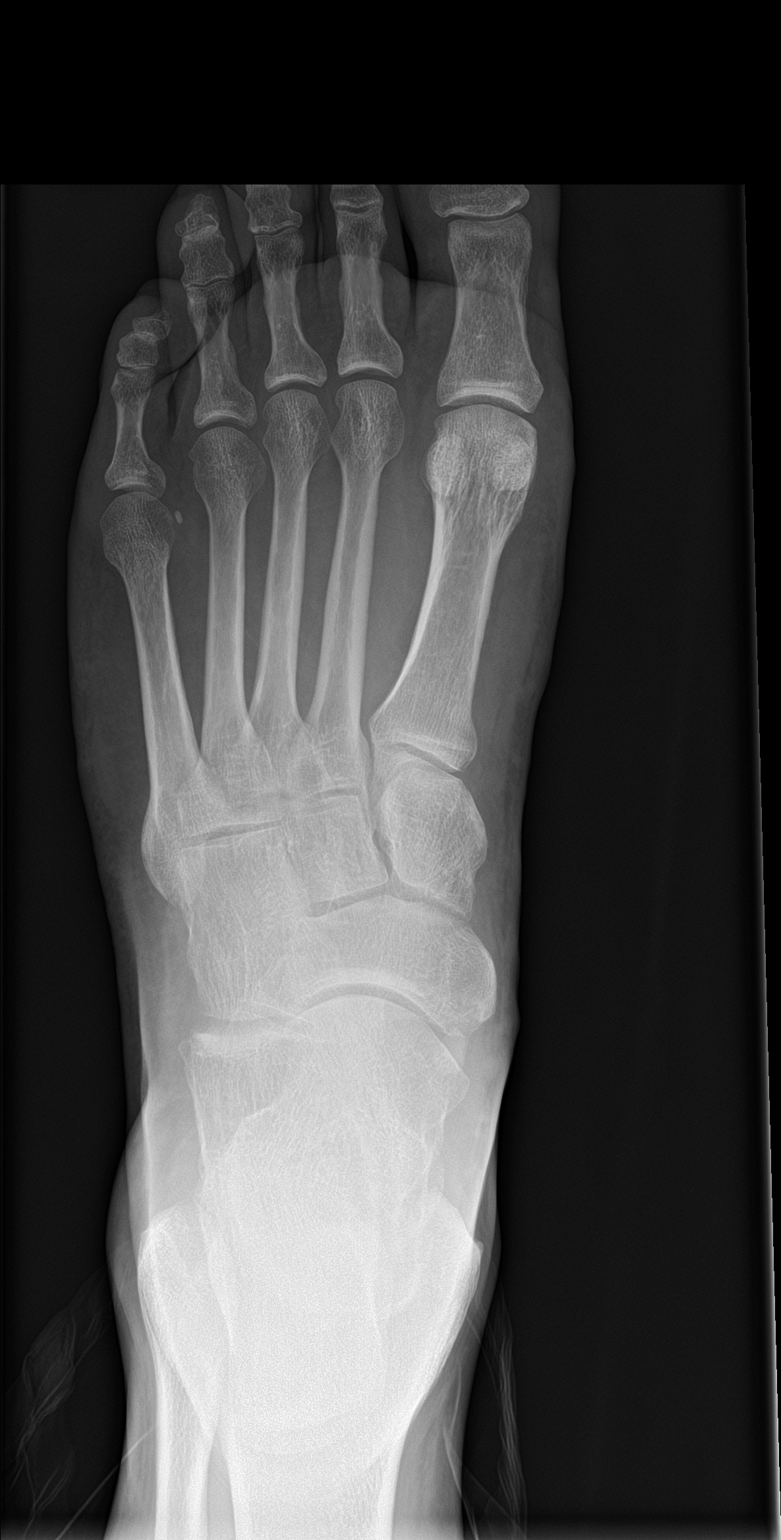

[foot obl]
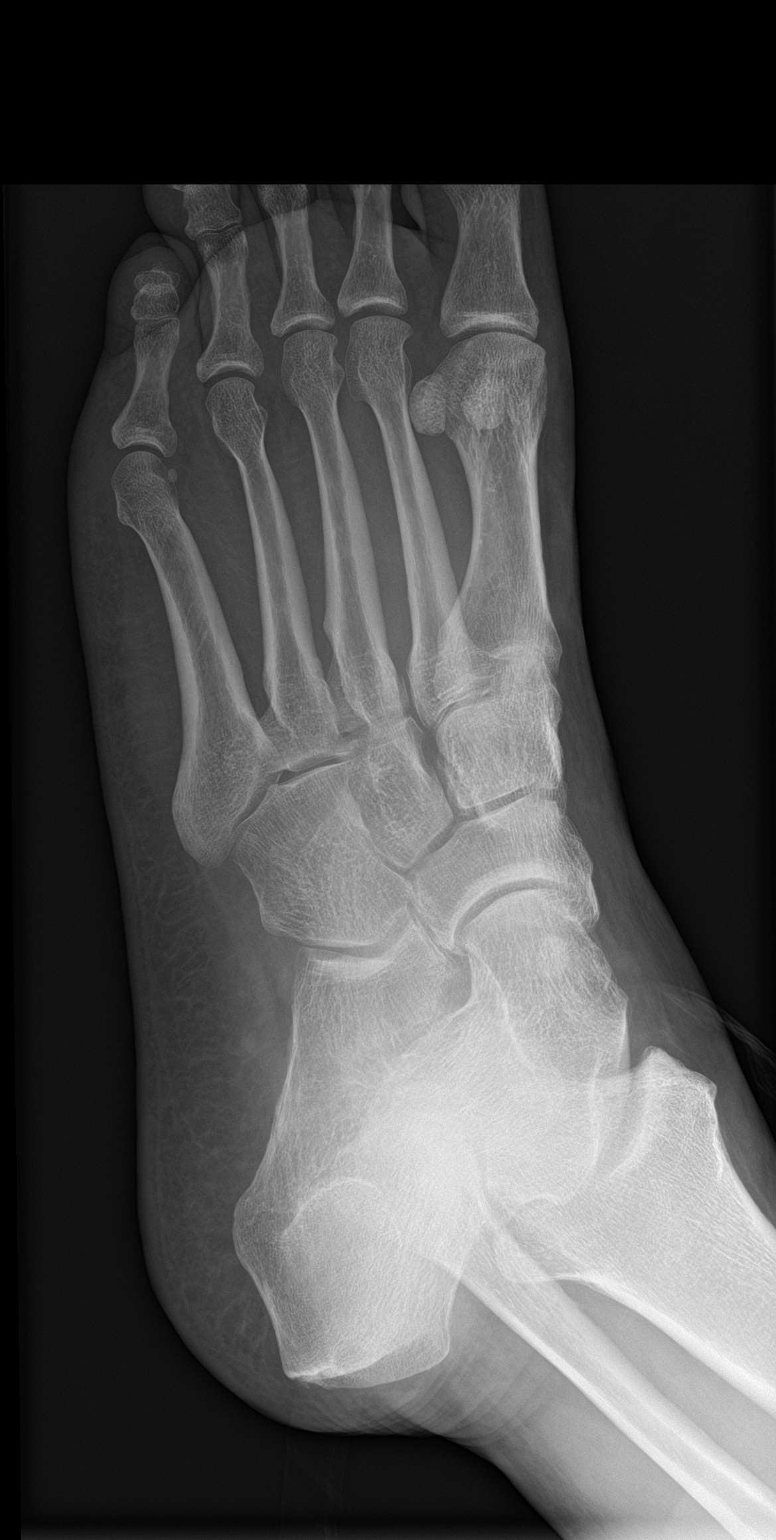

[foot lat]
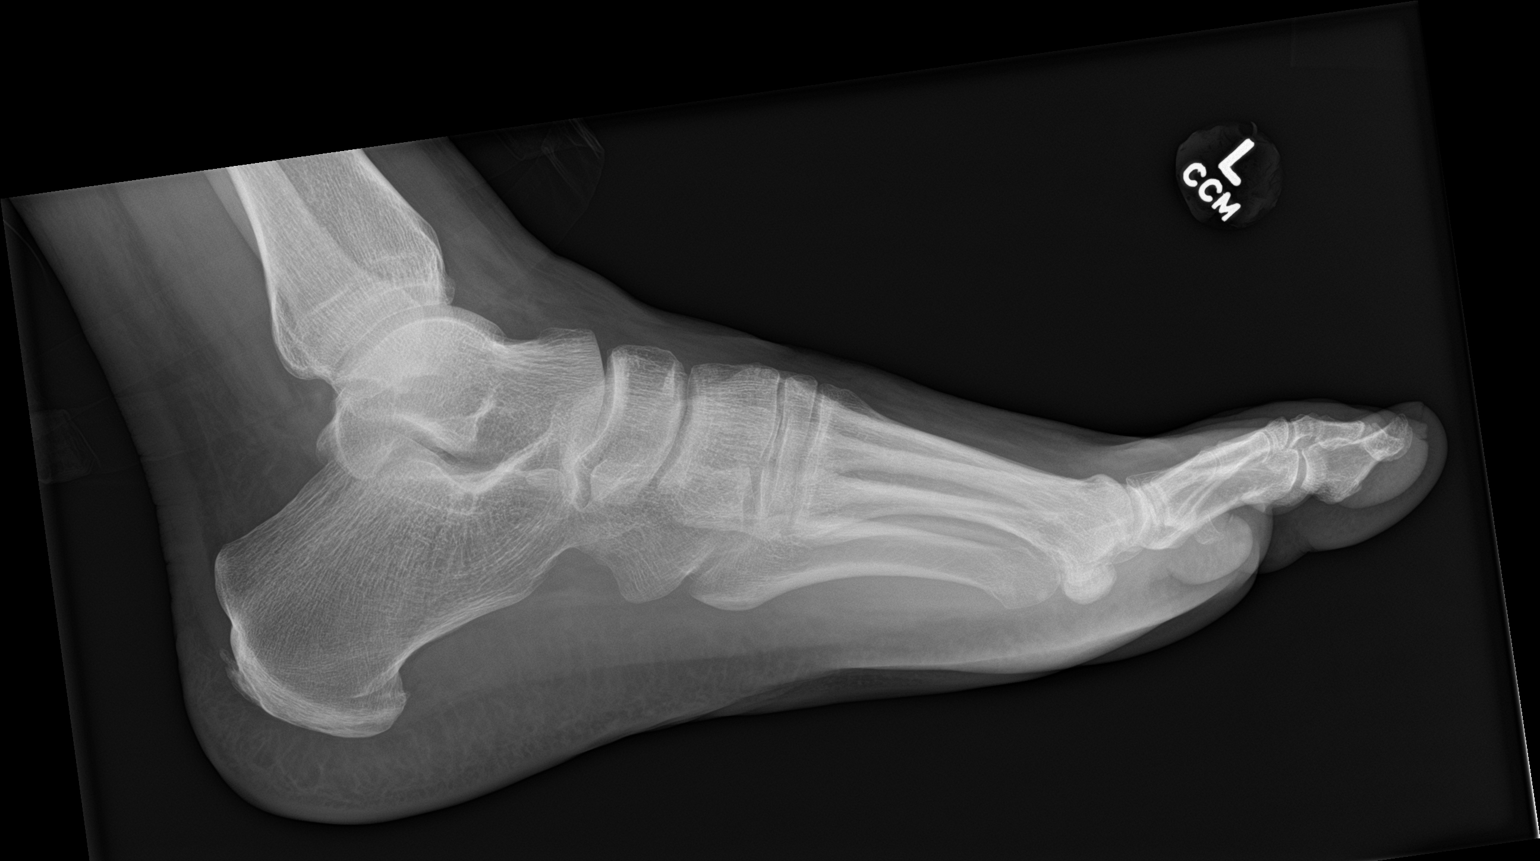

[3 of 3 positions shown; findings below may reference images not displayed]

FINDINGS: Frontal, oblique, and lateral views of the left foot are obtained.
No acute fracture, subluxation, or dislocation. Joint spaces are
well preserved. On the lateral view, there appears to be bony
bridging between the anterior process of the calcaneus and the
cuboid, compatible with congenital coalition. Soft tissues are
unremarkable.
IMPRESSION: 1. No acute bony abnormality.
2. Suspected congenital coalition between the calcaneus and the
cuboid.

## 2023-03-19 ENCOUNTER — Ambulatory Visit
Admission: EM | Admit: 2023-03-19 | Discharge: 2023-03-19 | Disposition: A | Discharge status: Home / Self Care | Payer: No Typology Code available for payment source | Attending: Family Medicine | Admitting: Family Medicine

## 2023-03-19 ENCOUNTER — Other Ambulatory Visit: Payer: Self-pay

## 2023-03-19 DIAGNOSIS — R509 Fever, unspecified: Secondary | ICD-10-CM

## 2023-03-19 DIAGNOSIS — R52 Pain, unspecified: Secondary | ICD-10-CM

## 2023-03-19 DIAGNOSIS — R059 Cough, unspecified: Secondary | ICD-10-CM

## 2023-03-19 DIAGNOSIS — J069 Acute upper respiratory infection, unspecified: Secondary | ICD-10-CM

## 2023-03-19 LAB — POC SARS CORONAVIRUS 2 AG -  ED: SARS Coronavirus 2 Ag: NEGATIVE

## 2023-03-19 LAB — POCT INFLUENZA A/B: Influenza A, POC: NEGATIVE

## 2023-03-19 MED ORDER — PREDNISONE 20 MG PO TABS: ORAL_TABLET | ORAL | 0 refills | Status: AC

## 2023-03-19 MED ORDER — DOXYCYCLINE HYCLATE 100 MG PO CAPS: 100.0000 mg | ORAL_CAPSULE | Freq: Two times a day (BID) | ORAL | 0 refills | Status: AC

## 2023-03-19 NOTE — ED Triage Notes (Signed)
Pt presents to uc with co of cough, fever, chills, congestion since Friday. Pt has been taking tylenol, and motrin and musinex.

## 2023-03-19 NOTE — Discharge Instructions (Addendum)
Advised patient to take medications as directed with food to completion.  Advised patient to take prednisone with first dose of doxycycline for the next 5 of 7 days.  Advised patient may take OTC Tylenol 1 g every 6 hours for fever (oral temperature greater than 100.3).  Encouraged to increase daily water intake to 64 ounces per day while taking these medications.  Advised if symptoms worsen and/or unresolved please follow-up PCP or here for further evaluation.

## 2023-03-19 NOTE — ED Provider Notes (Signed)
[name]    CSN: [id number] Arrival date & time: 03/19/23  1004      History   Chief Complaint Chief Complaint  Patient presents with   URI    HPI Edward Doyle is a 52 y.o. male.   HPI pleasant 52 year old male presents with cough, fever, chills, congestion since Friday or 2 days ago.  PMH significant for obesity, current everyday cigarette smoker, and HLD.  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 Authorizing Provider  doxycycline (VIBRAMYCIN) 100 MG capsule Take 1 capsule (100 mg total) by mouth 2 (two) times daily for 7 days. 03/19/23 03/26/23 Yes Edward Iha, FNP  predniSONE (DELTASONE) 20 MG tablet Take 3 tabs PO daily x 5 days. 03/19/23  Yes Edward Iha, FNP    Family History No family history on file.  Social History Social History   Tobacco Use   Smoking status: Every Day    Current packs/day: 1.00    Average packs/day: 1 pack/day for 25.0 years (25.0 ttl pk-yrs)    Types: Cigarettes   Smokeless tobacco: Never   Tobacco comments:    thinks about quitting every day  Vaping Use   Vaping status: Never Used  Substance Use Topics   Alcohol use: Yes    Alcohol/week: 2.0 standard drinks of alcohol    Types: 2 Cans of beer per week    Comment: occasionally   Drug use: No     Allergies   Patient has no known allergies.   Review of Systems Review of Systems  HENT:  Positive for congestion, postnasal drip and sore throat.   Respiratory:  Positive for  cough.   All other systems reviewed and are negative.    Physical Exam Triage Vital Signs ED Triage Vitals  Encounter Vitals Group     BP      Systolic BP Percentile      Diastolic BP Percentile      Pulse      Resp      Temp      Temp src      SpO2      Weight      Height      Head Circumference      Peak Flow      Pain Score      Pain Loc      Pain Education      Exclude from Growth Chart    No data found.  Updated Vital Signs BP 126/81   Pulse 73   Temp (S) 99.6 F (37.6 C) Comment: motrin at 9 am  Resp 16   SpO2 98%    Physical Exam Vitals and nursing note reviewed.  Constitutional:      Appearance: Normal appearance. He is normal weight.  HENT:     Head: Normocephalic and atraumatic.     Right Ear: Tympanic membrane, ear canal and external ear normal.     Left Ear:  Tympanic membrane, ear canal and external ear normal.     Nose: Nose normal.     Mouth/Throat:     Mouth: Mucous membranes are moist.     Pharynx: Oropharynx is clear.  Eyes:     Extraocular Movements: Extraocular movements intact.     Conjunctiva/sclera: Conjunctivae normal.     Pupils: Pupils are equal, round, and reactive to light.  Cardiovascular:     Rate and Rhythm: Normal rate and regular rhythm.     Pulses: Normal pulses.     Heart sounds: Normal heart sounds.  Pulmonary:     Effort: Pulmonary effort is normal.     Breath sounds: Normal breath sounds. No wheezing, rhonchi or rales.  Musculoskeletal:        General: Normal range of motion.     Cervical back: Normal range of motion and neck supple.  Skin:    General: Skin is warm and dry.  Neurological:     General: No focal deficit present.     Mental Status: He is alert and oriented to person, place, and time. Mental status is at baseline.  Psychiatric:        Mood and Affect: Mood normal.        Behavior: Behavior normal.      UC Treatments / Results  Labs (all labs ordered are listed, but only abnormal results are  displayed) Labs Reviewed  POCT INFLUENZA A/B  POC SARS CORONAVIRUS 2 AG -  ED    EKG   Radiology No results found.  Procedures Procedures (including critical care time)  Medications Ordered in UC Medications - No data to display  Initial Impression / Assessment and Plan / UC Course  I have reviewed the triage vital signs and the nursing notes.  Pertinent labs & imaging results that were available during my care of the patient were reviewed by me and considered in my medical decision making (see chart for details).     MDM: 1. Acute URI-Rx'd Doxycycline 100 mg capsule: Take 1 capsule twice daily x 7 days; 2. Cough-Rx'd prednisone 20 mg tablet: Take 3 tabs p.o. daily x 5 days; 3.  Generalized body aches-COVID-19 influenza A/B - today; 4.  Fever-Advised patient may take OTC Tylenol 1 g every 6 hours for fever (oral temperature greater than 100.3). Advised patient to take medications as directed with food to completion.  Advised patient to take prednisone with first dose of doxycycline for the next 5 of 7 days.  Advised patient may take OTC Tylenol 1 g every 6 hours for fever (oral temperature greater than 100.3).  Encouraged to increase daily water intake to 64 ounces per day while taking these medications.  Advised if symptoms worsen and/or unresolved please follow-up PCP or here for further evaluation.  Patient discharged home, hemodynamically stable. Final Clinical Impressions(s) / UC Diagnoses   Final diagnoses:  Generalized body aches  Fever, unspecified  Cough, unspecified type  Acute URI     Discharge Instructions      Advised patient to take medications as directed with food to completion.  Advised patient to take prednisone with first dose of doxycycline for the next 5 of 7 days.  Advised patient may take OTC Tylenol 1 g every 6 hours for fever (oral temperature greater than 100.3).  Encouraged to increase daily water intake to 64 ounces per day while taking these  medications.  Advised if symptoms worsen and/or unresolved please follow-up PCP or here for further evaluation.  ED Prescriptions     Medication Sig Dispense Auth. Provider   doxycycline (VIBRAMYCIN) 100 MG capsule Take 1 capsule (100 mg total) by mouth 2 (two) times daily for 7 days. 14 capsule Edward Iha, FNP   predniSONE (DELTASONE) 20 MG tablet Take 3 tabs PO daily x 5 days. 15 tablet Edward Iha, FNP      PDMP not reviewed this encounter.   Edward Iha, FNP 03/19/23 1115
